# Patient Record
Sex: Female | Born: 1990 | Race: Black or African American | Hispanic: No | Marital: Single | Smoking: Former smoker
Health system: Southern US, Community
[De-identification: ages and names within clinical notes are randomized; demographics above are authoritative.]

## PROBLEM LIST (undated history)

## (undated) ENCOUNTER — Inpatient Hospital Stay (HOSPITAL_COMMUNITY): Payer: Self-pay

## (undated) DIAGNOSIS — A749 Chlamydial infection, unspecified: Secondary | ICD-10-CM

## (undated) DIAGNOSIS — R002 Palpitations: Secondary | ICD-10-CM

## (undated) DIAGNOSIS — R55 Syncope and collapse: Secondary | ICD-10-CM

## (undated) DIAGNOSIS — O139 Gestational [pregnancy-induced] hypertension without significant proteinuria, unspecified trimester: Secondary | ICD-10-CM

## (undated) DIAGNOSIS — R42 Dizziness and giddiness: Secondary | ICD-10-CM

## (undated) DIAGNOSIS — N83209 Unspecified ovarian cyst, unspecified side: Secondary | ICD-10-CM

## (undated) HISTORY — PX: NO PAST SURGERIES: SHX2092

## (undated) HISTORY — DX: Palpitations: R00.2

## (undated) HISTORY — DX: Syncope and collapse: R55

## (undated) HISTORY — DX: Dizziness and giddiness: R42

---

## 2016-01-16 ENCOUNTER — Encounter (HOSPITAL_COMMUNITY): Payer: Self-pay | Admitting: Emergency Medicine

## 2016-01-16 ENCOUNTER — Emergency Department (HOSPITAL_COMMUNITY)
Admission: EM | Admit: 2016-01-16 | Discharge: 2016-01-16 | Disposition: A | Discharge status: Home / Self Care | Payer: Medicaid Other | Attending: Emergency Medicine | Admitting: Emergency Medicine

## 2016-01-16 DIAGNOSIS — B9689 Other specified bacterial agents as the cause of diseases classified elsewhere: Secondary | ICD-10-CM

## 2016-01-16 DIAGNOSIS — F17210 Nicotine dependence, cigarettes, uncomplicated: Secondary | ICD-10-CM | POA: Diagnosis not present

## 2016-01-16 DIAGNOSIS — N760 Acute vaginitis: Secondary | ICD-10-CM | POA: Diagnosis not present

## 2016-01-16 DIAGNOSIS — Z3202 Encounter for pregnancy test, result negative: Secondary | ICD-10-CM | POA: Insufficient documentation

## 2016-01-16 DIAGNOSIS — R1030 Lower abdominal pain, unspecified: Secondary | ICD-10-CM | POA: Diagnosis present

## 2016-01-16 LAB — WET PREP, GENITAL
SPERM: NONE SEEN
TRICH WET PREP: NONE SEEN
YEAST WET PREP: NONE SEEN

## 2016-01-16 LAB — URINALYSIS, ROUTINE W REFLEX MICROSCOPIC
Bilirubin Urine: NEGATIVE
Glucose, UA: NEGATIVE mg/dL
Hgb urine dipstick: NEGATIVE
KETONES UR: NEGATIVE mg/dL
LEUKOCYTES UA: NEGATIVE
NITRITE: NEGATIVE
PH: 6 (ref 5.0–8.0)
Protein, ur: NEGATIVE mg/dL
SPECIFIC GRAVITY, URINE: 1.026 (ref 1.005–1.030)

## 2016-01-16 LAB — POC URINE PREG, ED: PREG TEST UR: NEGATIVE

## 2016-01-16 MED ORDER — LIDOCAINE HCL (PF) 1 % IJ SOLN
INTRAMUSCULAR | Status: AC
  Administered 2016-01-16: 5 mL
  Filled 2016-01-16: qty 5

## 2016-01-16 MED ORDER — KETOROLAC TROMETHAMINE 30 MG/ML IJ SOLN
30.0000 mg | Freq: Once | INTRAMUSCULAR | Status: AC
  Administered 2016-01-16: 30 mg via INTRAMUSCULAR
  Filled 2016-01-16: qty 1

## 2016-01-16 MED ORDER — METRONIDAZOLE 500 MG PO TABS: 500.0000 mg | ORAL_TABLET | Freq: Two times a day (BID) | ORAL | Status: DC

## 2016-01-16 MED ORDER — CEFTRIAXONE SODIUM 250 MG IJ SOLR
250.0000 mg | Freq: Once | INTRAMUSCULAR | Status: AC
  Administered 2016-01-16: 250 mg via INTRAMUSCULAR
  Filled 2016-01-16: qty 250

## 2016-01-16 MED ORDER — AZITHROMYCIN 250 MG PO TABS
1000.0000 mg | ORAL_TABLET | Freq: Every day | ORAL | Status: DC
  Administered 2016-01-16: 1000 mg via ORAL
  Filled 2016-01-16: qty 4

## 2016-01-16 NOTE — ED Notes (Signed)
Pt to ER via [facility] with complaint of lower abdominal pain that began 2 days ago. Pt reports white, thick discharge x1 week with foul odor. Pt is a/o x4. Reports ovarian cyst hx and that this feels different.

## 2016-01-16 NOTE — ED Provider Notes (Signed)
CSN: [id number]     Arrival date & time 01/16/16  1620 History   First MD Initiated Contact with Patient 01/16/16 1633     Chief Complaint  Patient presents with  . Abdominal Pain   (Consider location/radiation/quality/duration/timing/severity/associated sxs/prior Treatment) HPI 25 y.o. female with a hx of Ovarian Cysts, presents to the Emergency Department today complaining via EMS of lower abdominal pain x 2 days. States that the pain is 9/10 and feels like a sharp/cramping pain on both sides of abdomen. Has tried OTC naproxen with minimal relief. Notes white vaginal discharge with odor. No dysuria. No fevers. No CP/SOB. No headaches. No N/V/D. No other symptoms noted. Pt states that she is sexually active with one partner.   LMP- 12-11-15   History reviewed. No pertinent past medical history. History reviewed. No pertinent past surgical history. History reviewed. No pertinent family history. Social History  Substance Use Topics  . Smoking status: Current Every Day Smoker -- 0.25 packs/day    Types: Cigarettes  . Smokeless tobacco: None  . Alcohol Use: Yes     Comment: occassionally    OB History    No data available     Review of Systems ROS reviewed and all are negative for acute change except as noted in the HPI.  Allergies  Review of patient's allergies indicates not on file.  Home Medications   Prior to Admission medications   Not on File   BP 123/78 mmHg  Pulse 96  Resp 16  SpO2 98%  LMP 12/11/2015 (Approximate)   Physical Exam  Constitutional: She is oriented to person, place, and time. She appears well-developed and well-nourished.  HENT:  Head: Normocephalic and atraumatic.  Eyes: EOM are normal. Pupils are equal, round, and reactive to light.  Neck: Normal range of motion. Neck supple.  Cardiovascular: Normal rate, regular rhythm and normal heart sounds.   No murmur heard. Pulmonary/Chest: Effort normal and breath sounds normal. No respiratory distress.  She has no wheezes. She has no rales. She exhibits no tenderness.  Abdominal: Soft. Normal appearance and bowel sounds are normal. There is tenderness in the right lower quadrant, suprapubic area and left lower quadrant. There is no rigidity, no rebound, no guarding, no CVA tenderness, no tenderness at McBurney's point and negative Murphy's sign.  Musculoskeletal: Normal range of motion.  Neurological: She is alert and oriented to person, place, and time.  Skin: Skin is warm and dry.  Psychiatric: She has a normal mood and affect. Her behavior is normal. Thought content normal.  Nursing note and vitals reviewed.  Exam performed by Eston Estersyler M Sharnetta Gielow,  exam chaperoned Date: 01/16/2016 Pelvic exam: normal external genitalia without evidence of trauma. VULVA: normal appearing vulva with no masses, tenderness or lesion. VAGINA: normal appearing vagina with normal color and discharge, no lesions. CERVIX: normal appearing cervix without lesions, cervical motion tenderness absent, cervical os closed with out purulent discharge; vaginal discharge - white, copious and thick, Wet prep and DNA probe for chlamydia and GC obtained.   ADNEXA: normal adnexa in size, nontender and no masses UTERUS: uterus is normal size, shape, consistency and nontender.    ED Course  Procedures (including critical care time) Labs Review Labs Reviewed  WET PREP, GENITAL - Abnormal; Notable for the following:    Clue Cells Wet Prep HPF POC FEW (*)    WBC, Wet Prep HPF POC FEW (*)    All other components within normal limits  URINALYSIS, ROUTINE W REFLEX MICROSCOPIC (NOT AT [facility])  POC URINE PREG, ED  GC/CHLAMYDIA PROBE AMP ([facility]) NOT AT [facility]   Imaging Review No results found. I have personally reviewed and evaluated these images and lab results as part of my medical decision-making.   EKG Interpretation None      MDM  I have reviewed and evaluated the relevant laboratory values.I have reviewed the relevant  previous healthcare records.I obtained HPI from historian. Patient discussed with supervising physician  ED Course:  Assessment: Pt is a 24yF with hx ovarian cysts who presents with lower abdominal pain x 2 days. On exam, pt in NAD. Nontoxic/nonseptic appearing. VSS. Afebrile. Lungs CTA. Heart RRR. Abdomen TTP RLQ/LLQ/Suprapubic. Pelvic Exam showed copious white vaginal discharge. No CMT. No adnexal. Labs show few clue cells and few white cells. UA unremarkable. Urine preg negative. Given analgesia in ED. Given Rocephin/Azithro in ED. Plan is to DC home with Flagyl. Counseled on possible STD based on exam with results pending. At time of discharge, Patient is in no acute distress. Vital Signs are stable. Patient is able to ambulate. Patient able to tolerate PO.    Disposition/Plan:  DC Home Additional Verbal discharge instructions given and discussed with patient.  Pt Instructed to f/u with PCP in the next week for evaluation and treatment of symptoms. Return precautions given Pt acknowledges and agrees with plan  Supervising Physician Leta Baptist, MD   Final diagnoses:  Bacterial vaginosis       Audry Pili, PA-C 01/16/16 1758  Leta Baptist, MD 01/17/16 [phone]

## 2016-01-16 NOTE — Discharge Instructions (Signed)
Please read and follow all provided instructions.  Your diagnoses today include:  1. Bacterial vaginosis    Tests performed today include:  Test for gonorrhea and chlamydia. You will be notified by telephone if you have a positive result.  Vital signs. See below for your results today.   Medications:  You were treated for chlamydia (1 gram azithromycin pills) and gonorrhea (250mg  rocephin shot). Also receiving   Home care instructions:  Read educational materials contained in this packet and follow any instructions provided.   You should tell your partners about your infection and avoid having sex for one week to allow time for the medicine to work.  Follow-up instructions: You should follow-up with the [facility] to be tested for HIV, syphilis, and hepatitis -- all of which can be transmitted by sexual contact. We do not routinely screen for these in the Emergency Department.  STD Testing:  [facility] [place] [place] [facility]  [address], [place], phone [phone] or [phone]    Monday - Friday, call for an appointment  [facility] [place] [place] [facility]  [address], [place], phone [phone] or [phone]   Monday - Friday, call for an appointment  Return instructions:   Please return to the Emergency Department if you experience worsening symptoms.   Please return if you have any other emergent concerns.  Additional Information:  Your vital signs today were: BP 123/78 mmHg   Pulse 96   Resp 16   SpO2 98%   LMP 12/11/2015 (Approximate) If your blood pressure (BP) was elevated above 135/85 this visit, please have this repeated by your doctor within one month. --------------

## 2016-01-17 LAB — GC/CHLAMYDIA PROBE AMP (~~LOC~~) NOT AT ARMC
Chlamydia: NEGATIVE
Neisseria Gonorrhea: NEGATIVE

## 2016-08-18 ENCOUNTER — Inpatient Hospital Stay (HOSPITAL_COMMUNITY): Payer: Medicaid Other

## 2016-08-18 ENCOUNTER — Inpatient Hospital Stay (HOSPITAL_COMMUNITY)
Admission: AD | Admit: 2016-08-18 | Discharge: 2016-08-18 | Disposition: A | Discharge status: Home / Self Care | Payer: Medicaid Other | Source: Ambulatory Visit | Attending: Obstetrics and Gynecology | Admitting: Obstetrics and Gynecology

## 2016-08-18 ENCOUNTER — Encounter (HOSPITAL_COMMUNITY): Payer: Self-pay | Admitting: *Deleted

## 2016-08-18 DIAGNOSIS — F17210 Nicotine dependence, cigarettes, uncomplicated: Secondary | ICD-10-CM | POA: Diagnosis not present

## 2016-08-18 DIAGNOSIS — R109 Unspecified abdominal pain: Secondary | ICD-10-CM

## 2016-08-18 DIAGNOSIS — O26891 Other specified pregnancy related conditions, first trimester: Secondary | ICD-10-CM | POA: Diagnosis not present

## 2016-08-18 DIAGNOSIS — R1032 Left lower quadrant pain: Secondary | ICD-10-CM | POA: Diagnosis present

## 2016-08-18 DIAGNOSIS — O209 Hemorrhage in early pregnancy, unspecified: Secondary | ICD-10-CM

## 2016-08-18 DIAGNOSIS — O99330 Smoking (tobacco) complicating pregnancy, unspecified trimester: Secondary | ICD-10-CM | POA: Diagnosis not present

## 2016-08-18 HISTORY — DX: Chlamydial infection, unspecified: A74.9

## 2016-08-18 HISTORY — DX: Gestational (pregnancy-induced) hypertension without significant proteinuria, unspecified trimester: O13.9

## 2016-08-18 HISTORY — DX: Unspecified ovarian cyst, unspecified side: N83.209

## 2016-08-18 LAB — URINALYSIS, ROUTINE W REFLEX MICROSCOPIC
Bilirubin Urine: NEGATIVE
GLUCOSE, UA: NEGATIVE mg/dL
Ketones, ur: 15 mg/dL — AB
LEUKOCYTES UA: NEGATIVE
Nitrite: NEGATIVE
PH: 6 (ref 5.0–8.0)
PROTEIN: NEGATIVE mg/dL
Specific Gravity, Urine: >1.03 — ABNORMAL HIGH (ref 1.005–1.030)

## 2016-08-18 LAB — CBC
HCT: 36.1 % (ref 36.0–46.0)
Hemoglobin: 11.9 g/dL — ABNORMAL LOW (ref 12.0–15.0)
MCH: 24.5 pg — AB (ref 26.0–34.0)
MCHC: 33 g/dL (ref 30.0–36.0)
MCV: 74.3 fL — ABNORMAL LOW (ref 78.0–100.0)
Platelets: 281 10*3/uL (ref 150–400)
RBC: 4.86 MIL/uL (ref 3.87–5.11)
RDW: 15.2 % (ref 11.5–15.5)
WBC: 6.8 10*3/uL (ref 4.0–10.5)

## 2016-08-18 LAB — WET PREP, GENITAL
CLUE CELLS WET PREP: NONE SEEN
Sperm: NONE SEEN
TRICH WET PREP: NONE SEEN
YEAST WET PREP: NONE SEEN

## 2016-08-18 LAB — URINE MICROSCOPIC-ADD ON
Bacteria, UA: NONE SEEN
RBC / HPF: NONE SEEN RBC/hpf (ref 0–5)

## 2016-08-18 LAB — POCT PREGNANCY, URINE: Preg Test, Ur: POSITIVE — AB

## 2016-08-18 LAB — HCG, QUANTITATIVE, PREGNANCY: HCG, BETA CHAIN, QUANT, S: 518 m[IU]/mL — AB (ref <5)

## 2016-08-18 LAB — ABO/RH: ABO/RH(D): O POS

## 2016-08-18 MED ORDER — PROMETHAZINE HCL 25 MG PO TABS: 25.0000 mg | ORAL_TABLET | Freq: Four times a day (QID) | ORAL | 0 refills | Status: DC | PRN

## 2016-08-18 NOTE — Discharge Instructions (Signed)

## 2016-08-18 NOTE — MAU Note (Signed)
Just found out she was preg.  On the 11th, started bleeding out of nowhere, is spotting now..  On the ? 8th- had bad cramping  In LLQ. No longer having the pain.  Went to health dept today, found out she was preg.  Period at the end of Oct, came early

## 2016-08-18 NOTE — MAU Provider Note (Signed)
History     CSN: [id number]  Arrival date and time: 08/18/16 1255   None     Chief Complaint  Patient presents with  . Abdominal Pain  . Vaginal Bleeding  . Possible Pregnancy   HPI  Pt is 25 y.o. G3P1011 Unknownwho was sent from the Health Dept with + pregnancy test when she went for appointment for contraception.  Pt had bleeding on 11/11 but spotting last 3 days.  Pt has also had LLQ cramping- pain not noticeable now.   Pt has been nauseated but not vomiting. Pt denies constipation, diarrhea or UTI sx. Pt has hx of SAB at about [redacted] weeks [place] about [place] ago [place].  RN note; Wendy FoundsJolynn K Spurlock-Frizzell, RN    [] Hide copied text [] Hover for attribution information Just found out she was preg.  On the 11th, started bleeding out of nowhere, is spotting now..  On the ? 8th- had bad cramping  In LLQ. No longer having the pain.  Went to health dept today, found out she was preg.  Period at the end of Oct, came early       No past medical history on file.  No past surgical history on file.  No family history on file.  Social History  Substance Use Topics  . Smoking status: Current Every Day Smoker    Packs/day: 0.25    Types: Cigarettes  . Smokeless tobacco: Not on file  . Alcohol use Yes     Comment: occassionally     Allergies: No Known Allergies  Prescriptions Prior to Admission  Medication Sig Dispense Refill Last Dose  . metroNIDAZOLE (FLAGYL) 500 MG tablet Take 1 tablet (500 mg total) by mouth 2 (two) times daily. 14 tablet 0     Review of Systems  Constitutional: Negative for chills and fever.  Gastrointestinal: Positive for abdominal pain and nausea. Negative for constipation, diarrhea and vomiting.  Genitourinary: Negative for dysuria.   Physical Exam   Blood pressure 113/62, pulse 78, temperature 98.1 F (36.7 C), temperature source Oral, resp. rate 16, height 5\' 2"  (1.575 m), weight 125 lb 12.8 oz (57.1 kg), last menstrual period  07/31/2016.  Physical Exam  Nursing note and vitals reviewed. Constitutional: She is oriented to person, place, and time. She appears well-developed and well-nourished. No distress.  HENT:  Head: Normocephalic.  Eyes: Pupils are equal, round, and reactive to light.  Neck: Normal range of motion. Neck supple.  Cardiovascular: Normal rate.   Respiratory: Effort normal.  GI: Soft.  Genitourinary:  Genitourinary Comments: Small amount of dark brown blood in vault; cervix closed, NT; uterus NSSC NT  Musculoskeletal: Normal range of motion.  Neurological: She is alert and oriented to person, place, and time.  Skin: Skin is warm and dry.  Psychiatric: She has a normal mood and affect.    MAU Course  Procedures Results for orders placed or performed during the hospital encounter of 08/18/16 (from the past 24 hour(s))  Urinalysis, Routine w reflex microscopic (not at [facility])     Status: Abnormal   Collection Time: 08/18/16  1:15 PM  Result Value Ref Range   Color, Urine YELLOW YELLOW   APPearance CLEAR CLEAR   Specific Gravity, Urine >1.030 (H) 1.005 - 1.030   pH 6.0 5.0 - 8.0   Glucose, UA NEGATIVE NEGATIVE mg/dL   Hgb urine dipstick SMALL (A) NEGATIVE   Bilirubin Urine NEGATIVE NEGATIVE   Ketones, ur 15 (A) NEGATIVE mg/dL   Protein, ur NEGATIVE  NEGATIVE mg/dL   Nitrite NEGATIVE NEGATIVE   Leukocytes, UA NEGATIVE NEGATIVE  Urine microscopic-add on     Status: Abnormal   Collection Time: 08/18/16  1:15 PM  Result Value Ref Range   Squamous Epithelial / LPF 0-5 (A) NONE SEEN   WBC, UA 0-5 0 - 5 WBC/hpf   RBC / HPF NONE SEEN 0 - 5 RBC/hpf   Bacteria, UA NONE SEEN NONE SEEN   Urine-Other MUCOUS PRESENT   Pregnancy, urine POC     Status: Abnormal   Collection Time: 08/18/16  1:21 PM  Result Value Ref Range   Preg Test, Ur POSITIVE (A) NEGATIVE  CBC     Status: Abnormal   Collection Time: 08/18/16  1:27 PM  Result Value Ref Range   WBC 6.8 4.0 - 10.5 K/uL   RBC 4.86 3.87 - 5.11  MIL/uL   Hemoglobin 11.9 (L) 12.0 - 15.0 g/dL   HCT 13.036.1 86.536.0 - 78.446.0 %   MCV 74.3 (L) 78.0 - 100.0 fL   MCH 24.5 (L) 26.0 - 34.0 pg   MCHC 33.0 30.0 - 36.0 g/dL   RDW 69.615.2 29.511.5 - 28.415.5 %   Platelets 281 150 - 400 K/uL  hCG, quantitative, pregnancy     Status: Abnormal   Collection Time: 08/18/16  1:27 PM  Result Value Ref Range   hCG, Beta Chain, Quant, S 518 (H) <5 mIU/mL  ABO/Rh     Status: None   Collection Time: 08/18/16  1:27 PM  Result Value Ref Range   ABO/RH(D) O POS   Wet prep, genital     Status: Abnormal   Collection Time: 08/18/16  1:45 PM  Result Value Ref Range   Yeast Wet Prep HPF POC NONE SEEN NONE SEEN   Trich, Wet Prep NONE SEEN NONE SEEN   Clue Cells Wet Prep HPF POC NONE SEEN NONE SEEN   WBC, Wet Prep HPF POC FEW (A) NONE SEEN   Sperm NONE SEEN   GC/chlamydia pending Cannot rule out ectopic Assessment and Plan  Abdominal pain in pregnancy Pregnancy of unknown anatomical location Return Thursday afternoon b/t 1-3pm for repeat HCG- in clinic Return sooner for increase in pain or bleeding  LINEBERRY,Wendy Maddox 08/18/2016, 1:19 PM

## 2016-08-19 LAB — HIV ANTIBODY (ROUTINE TESTING W REFLEX): HIV Screen 4th Generation wRfx: NONREACTIVE

## 2016-08-19 LAB — GC/CHLAMYDIA PROBE AMP (~~LOC~~) NOT AT ARMC
CHLAMYDIA, DNA PROBE: NEGATIVE
NEISSERIA GONORRHEA: NEGATIVE

## 2016-08-20 ENCOUNTER — Encounter (HOSPITAL_COMMUNITY): Payer: Self-pay | Admitting: Student

## 2016-08-20 ENCOUNTER — Ambulatory Visit: Payer: Medicaid Other

## 2016-08-20 ENCOUNTER — Inpatient Hospital Stay (HOSPITAL_COMMUNITY)
Admission: AD | Admit: 2016-08-20 | Discharge: 2016-08-20 | Disposition: A | Discharge status: Home / Self Care | Payer: Medicaid Other | Source: Ambulatory Visit | Attending: Obstetrics & Gynecology | Admitting: Obstetrics & Gynecology

## 2016-08-20 DIAGNOSIS — Z87891 Personal history of nicotine dependence: Secondary | ICD-10-CM | POA: Insufficient documentation

## 2016-08-20 DIAGNOSIS — O0281 Inappropriate change in quantitative human chorionic gonadotropin (hCG) in early pregnancy: Secondary | ICD-10-CM | POA: Insufficient documentation

## 2016-08-20 LAB — HCG, QUANTITATIVE, PREGNANCY: HCG, BETA CHAIN, QUANT, S: 617 m[IU]/mL — AB (ref <5)

## 2016-08-20 NOTE — Progress Notes (Signed)
Pt here today for STAT beta lab which was mistakenly scheduled for 1530.  Per Dr. Adrian BlackwaterStinson, pt can go to MAU for beta lab.  Notified Morrie Sheldonshley, MAU charge, that pt will be coming up for just a STAT beta lab due to the pt not being able to stay at the office due to office closing at 1700.  Pt agreed and had no further questions.

## 2016-08-20 NOTE — MAU Note (Signed)
Pt here for f/u lab work. Reports she is stil lhaving vaginal bleeding a little heavier than it was the other day. Denies any pain or cramping.

## 2016-08-20 NOTE — MAU Provider Note (Signed)
History   [id number]   Chief Complaint  Patient presents with  . Follow-up    HPI Wendy Maddox is a 25 y.o. female [phone] here for follow-up BHCG.  Upon review of the records patient was first seen on 11/14 for abdominal pain & vaginal bleeding. BHCG on that day was 518. Ultrasound showed no IUP, small echogenic area adjacent to left ovary, can't r/o ectopic. GC/CT and wet prep were collected.  Results were negative. Pt discharged home to return for BHCG. Pt here today with no report of abdominal pain. Some vaginal bleeding since last night. All other systems negative.   Patient's last menstrual period was 07/31/2016.  OB History  Gravida Para Term Preterm AB Living  4 1 1   1 1   SAB TAB Ectopic Multiple Live Births  1       1    # Outcome Date GA Lbr Len/2nd Weight Sex Delivery Anes PTL Lv  4 Current           3 Gravida           2 SAB           1 Term               Past Medical History:  Diagnosis Date  . Chlamydia   . Ovarian cyst   . Pregnancy induced hypertension     Family History  Problem Relation Age of Onset  . Diabetes Mother   . Asthma Father   . Asthma Daughter   . Diabetes Maternal Grandmother   . Diabetes Paternal Grandmother     Social History   Social History  . Marital status: Single    Spouse name: N/A  . Number of children: N/A  . Years of education: N/A   Social History Main Topics  . Smoking status: Former Smoker    Packs/day: 0.25    Types: Cigarettes  . Smokeless tobacco: Never Used     Comment: Oct 2017  . Alcohol use Yes     Comment: every other week  . Drug use: No  . Sexual activity: Yes   Other Topics Concern  . None   Social History Narrative  . None    No Known Allergies  No current facility-administered medications on file prior to encounter.    Current Outpatient Prescriptions on File Prior to Encounter  Medication Sig Dispense Refill  . acetaminophen (TYLENOL) 500 MG tablet Take 1,000 mg by mouth every 6  (six) hours as needed for mild pain, moderate pain or headache.    . promethazine (PHENERGAN) 25 MG tablet Take 1 tablet (25 mg total) by mouth every 6 (six) hours as needed for nausea or vomiting. 30 tablet 0     Physical Exam   Vitals:   08/20/16 1713  BP: 139/94  Pulse: 88  Resp: 18  Temp: 98.4 F (36.9 C)  TempSrc: Oral    Physical Exam  Nursing note and vitals reviewed. Constitutional: She is oriented to person, place, and time. She appears well-developed and well-nourished. No distress.  HENT:  Head: Normocephalic and atraumatic.  Eyes: Conjunctivae are normal. Right eye exhibits no discharge. Left eye exhibits no discharge. No scleral icterus.  Neck: Normal range of motion.  Cardiovascular: Normal rate.   Respiratory: Effort normal. No respiratory distress.  GI: Soft. There is no tenderness.  Neurological: She is alert and oriented to person, place, and time.  Skin: Skin is warm and dry. She is not diaphoretic.  Psychiatric: She has a normal mood and affect. Her behavior is normal. Judgment and thought content normal.    MAU Course  Procedures Component     Latest Ref Rng & Units 08/18/2016 08/20/2016  HCG, Beta Chain, Quant, S     <5 mIU/mL 518 (H) 617 (H)   MDM Discussed results with Dr. Jolayne Pantheronstant. Will offer MTX as this is not a normal pregnancy.  Discussed results & recommendation with pt. Pt appropriately upset regarding failed pregnancy but declines MTX at this time. Pt agreeable to returning for repeat BHCG in 2 days. Discussed risks associated with untreated ectopic pregnancy including emergent surgery, loss of fertility, and death.   Assessment and Plan  25 y.o. G4P1011 at [id number] wks Pregnancy Follow-up BHCG 1. Inappropriate change in quantitative hCG in early pregnancy    P: Discharge home Strict return precautions discussed Return Saturday evening to MAU for BHCG   Judeth HornErin Mathews Stuhr, NP 08/20/2016 5:41 PM

## 2016-08-20 NOTE — Discharge Instructions (Signed)
Ectopic Pregnancy An ectopic pregnancy happens when a fertilized egg grows outside the uterus. A pregnancy cannot live outside of the uterus. This problem often happens in the fallopian tube. It is often caused by damage to the fallopian tube. If this problem is found early, you may be treated with medicine. If your tube tears or bursts open (ruptures), you will bleed inside. This is an emergency. You will need surgery. Get help right away. What are the signs or symptoms? You may have normal pregnancy symptoms at first. These include:  Missing your period.  Feeling sick to your stomach (nauseous).  Being tired.  Having tender breasts. Then, you may start to have symptoms that are not normal. These include:  Pain with sex (intercourse).  Bleeding from the vagina. This includes light bleeding (spotting).  Belly (abdomen) or lower belly cramping or pain. This may be felt on one side.  A fast heartbeat (pulse).  Passing out (fainting) after going poop (bowel movement). If your tube tears, you may have symptoms such as:  Really bad pain in the belly or lower belly. This happens suddenly.  Dizziness.  Passing out.  Shoulder pain. Get help right away if: You have any of these symptoms. This is an emergency. This information is not intended to replace advice given to you by your health care provider. Make sure you discuss any questions you have with your health care provider. Document Released: 12/18/2008 Document Revised: 02/27/2016 Document Reviewed: 05/03/2013 Elsevier Interactive Patient Education  2017 [employer] Abdominal Pain During Pregnancy Abdominal pain is common in pregnancy. Most of the time, it does not cause harm. There are many causes of abdominal pain. Some causes are more serious than others and sometimes the cause is not known. Abdominal pain can be a sign that something is very wrong with the pregnancy or the pain may have nothing to do with the pregnancy.  Always tell your health care provider if you have any abdominal pain. Follow these instructions at home:  Do not have sex or put anything in your vagina until your symptoms go away completely.  Watch your abdominal pain for any changes.  Get plenty of rest until your pain improves.  Drink enough fluid to keep your urine clear or pale yellow.  Take over-the-counter or prescription medicines only as told by your health care provider.  Keep all follow-up visits as told by your health care provider. This is important. Contact a health care provider if:  You have a fever.  Your pain gets worse or you have cramping.  Your pain continues after resting. Get help right away if:  You are bleeding, leaking fluid, or passing tissue from the vagina.  You have vomiting or diarrhea that does not go away.  You have painful or bloody urination.  You notice a decrease in your baby's movements.  You feel very weak or faint.  You have shortness of breath.  You develop a severe headache with abdominal pain.  You have abnormal vaginal discharge with abdominal pain. This information is not intended to replace advice given to you by your health care provider. Make sure you discuss any questions you have with your health care provider. Document Released: 09/21/2005 Document Revised: 07/02/2016 Document Reviewed: 04/20/2013 Elsevier Interactive Patient Education  2017 [employer]

## 2016-08-22 ENCOUNTER — Inpatient Hospital Stay (HOSPITAL_COMMUNITY)
Admission: AD | Admit: 2016-08-22 | Discharge: 2016-08-22 | Disposition: A | Discharge status: Home / Self Care | Payer: Medicaid Other | Source: Ambulatory Visit | Attending: Obstetrics & Gynecology | Admitting: Obstetrics & Gynecology

## 2016-08-22 DIAGNOSIS — Z3490 Encounter for supervision of normal pregnancy, unspecified, unspecified trimester: Secondary | ICD-10-CM | POA: Diagnosis not present

## 2016-08-22 DIAGNOSIS — Z3A01 Less than 8 weeks gestation of pregnancy: Secondary | ICD-10-CM | POA: Insufficient documentation

## 2016-08-22 DIAGNOSIS — O26891 Other specified pregnancy related conditions, first trimester: Secondary | ICD-10-CM | POA: Diagnosis present

## 2016-08-22 DIAGNOSIS — O3680X0 Pregnancy with inconclusive fetal viability, not applicable or unspecified: Secondary | ICD-10-CM

## 2016-08-22 LAB — CBC WITH DIFFERENTIAL/PLATELET
BASOS ABS: 0 10*3/uL (ref 0.0–0.1)
Basophils Relative: 0 %
EOS ABS: 0.1 10*3/uL (ref 0.0–0.7)
EOS PCT: 1 %
HCT: 38.1 % (ref 36.0–46.0)
Hemoglobin: 12.5 g/dL (ref 12.0–15.0)
LYMPHS PCT: 30 %
Lymphs Abs: 2.8 10*3/uL (ref 0.7–4.0)
MCH: 24.3 pg — ABNORMAL LOW (ref 26.0–34.0)
MCHC: 32.8 g/dL (ref 30.0–36.0)
MCV: 74 fL — AB (ref 78.0–100.0)
Monocytes Absolute: 0.4 10*3/uL (ref 0.1–1.0)
Monocytes Relative: 4 %
Neutro Abs: 6 10*3/uL (ref 1.7–7.7)
Neutrophils Relative %: 65 %
PLATELETS: 313 10*3/uL (ref 150–400)
RBC: 5.15 MIL/uL — AB (ref 3.87–5.11)
RDW: 14.9 % (ref 11.5–15.5)
WBC: 9.3 10*3/uL (ref 4.0–10.5)

## 2016-08-22 LAB — CREATININE, SERUM
CREATININE: 0.65 mg/dL (ref 0.44–1.00)
GFR calc Af Amer: >60 mL/min (ref >60)

## 2016-08-22 LAB — BUN: BUN: 8 mg/dL (ref 6–20)

## 2016-08-22 LAB — HCG, QUANTITATIVE, PREGNANCY: HCG, BETA CHAIN, QUANT, S: 643 m[IU]/mL — AB (ref <5)

## 2016-08-22 LAB — AST: AST: 19 U/L (ref 15–41)

## 2016-08-22 MED ORDER — METHOTREXATE INJECTION FOR WOMEN'S HOSPITAL
50.0000 mg/m2 | Freq: Once | INTRAMUSCULAR | Status: AC
  Administered 2016-08-22: 80 mg via INTRAMUSCULAR
  Filled 2016-08-22: qty 1.6

## 2016-08-22 NOTE — MAU Provider Note (Signed)
History   Chief Complaint:  Follow-up   Wendy SchaumannMikalah Maddox is  25 y.o. G4P1011 Patient's last menstrual period was 07/31/2016.Marland Kitchen. Patient is here for follow up of quantitative HCG and ongoing surveillance of pregnancy status.   She is 3643w1d weeks gestation  by LMP. Since her last visit, the patient is without new complaint.   The patient reports bleeding as lighter than at her previous visit. General ROS: no abdominal pain, very light vaginal bleeding, no fever, no nausea or vomiting  Her previous Quantitative HCG values are: 08-18-16 = 518 08-20-16 = 617  Client declined methotrexate at her last visit, so follow up plan was to return in 48 hours which is today.    Physical Exam   Blood pressure 135/80, pulse 90, temperature 98.4 F (36.9 C), resp. rate 18, last menstrual period 07/31/2016.  GENERAL: Well-developed, well-nourished female in no acute distress.  HEENT: Normocephalic, atraumatic.  LUNGS: Effort normal  HEART: Regular rate  SKIN: Warm, dry and without erythema  PSYCH: Normal mood and affect   Labs:  hCG, Beta Chain, Quant, S <5 mIU/mL 643    617CM    518CM     Results for orders placed or performed during the hospital encounter of 08/22/16 (from the past 24 hour(s))  CBC WITH DIFFERENTIAL     Status: Abnormal   Collection Time: 08/22/16  4:34 PM  Result Value Ref Range   WBC 9.3 4.0 - 10.5 K/uL   RBC 5.15 (H) 3.87 - 5.11 MIL/uL   Hemoglobin 12.5 12.0 - 15.0 g/dL   HCT 16.138.1 09.636.0 - 04.546.0 %   MCV 74.0 (Wendy) 78.0 - 100.0 fL   MCH 24.3 (Wendy) 26.0 - 34.0 pg   MCHC 32.8 30.0 - 36.0 g/dL   RDW 40.914.9 81.111.5 - 91.415.5 %   Platelets 313 150 - 400 K/uL   Neutrophils Relative % 65 %   Neutro Abs 6.0 1.7 - 7.7 K/uL   Lymphocytes Relative 30 %   Lymphs Abs 2.8 0.7 - 4.0 K/uL   Monocytes Relative 4 %   Monocytes Absolute 0.4 0.1 - 1.0 K/uL   Eosinophils Relative 1 %   Eosinophils Absolute 0.1 0.0 - 0.7 K/uL   Basophils Relative 0 %   Basophils Absolute 0.0 0.0 - 0.1 K/uL  AST      Status: None   Collection Time: 08/22/16  4:34 PM  Result Value Ref Range   AST 19 15 - 41 U/Wendy  BUN     Status: None   Collection Time: 08/22/16  4:34 PM  Result Value Ref Range   BUN 8 6 - 20 mg/dL  Creatinine, serum     Status: None   Collection Time: 08/22/16  4:34 PM  Result Value Ref Range   Creatinine, Ser 0.65 0.44 - 1.00 mg/dL   GFR calc non Af Amer >60 >60 mL/min   GFR calc Af Amer >60 >60 mL/min  hCG, quantitative, pregnancy     Status: Abnormal   Collection Time: 08/22/16  4:45 PM  Result Value Ref Range   hCG, Beta Chain, Quant, S 643 (H) <5 mIU/mL     Ultrasound Studies:   CLINICAL DATA:  Vaginal bleeding. Left lower quadrant pain. Quantitative beta HCG 518.  EXAM: OBSTETRIC <14 WK US AND TRANSVAGINAL OB US  TECHNIQUE: Both transabdominal and transvaginal ultrasound examinations were performed for complete evaluation of the gestation as well as the maternal uterus, adnexal regions, and pelvic cul-de-sac. Transvaginal technique was performed to assess early  pregnancy.  COMPARISON:  None.  FINDINGS: Intrauterine gestational sac: None visualized  Yolk sac:  Not visualized  Embryo:  Not visualize   Subchorionic hemorrhage:  None visualized.  Maternal uterus/adnexae: Within the left adnexa, there is a small echogenic area adjacent to the left ovary measuring up to 14 mm. This may reflect a loop of collapsed bowel, but recommend close follow-up.  IMPRESSION: No intrauterine pregnancy visualized. Differential considerations would include early intrauterine pregnancy too early to visualize, spontaneous abortion, or occult ectopic pregnancy. In addition, there is a small echogenic area within the left adnexa adjacent to the left ovary which is indeterminate. This may represent a collapsed loop of bowel. Recommend close clinical followup and serial quantitative beta HCGs and ultrasounds.  Consult with Dr. Penne LashLeggett who reviewed the BHCG levels  and the ultrasound.  Plan is to recommend methotrexate for abnormally rising quant levels.  Blood type O positive  Client and her mother here.  Disappointed that this is not a normal pregnancy, but client wants to proceed with methotrexate treatment which is recommended again today.  Reviewed possible risks of continuing an ectopic pregnancy; client consents to methotrexate and tells her mother she wants the medication.  Assessment: Abnormally rising quant Pregnancy of unknown anatomic location  Plan: Reviewed again that the pregnancy hormone level trend does not represent a normally developing pregnancy and is consistent with an ectopic pregnancy.  Client was given info to read at her last visit and wants to proceed with the methotrexate treatment. Additional labs ordered prior to methotrexate injection and these were normal. Methotrexate given in MAU  Return on Tuesday after work at 3 pm to MAU for Day 4 labs after methotrexate. ([facility] Return on Friday morning to the Maternity Admissions Unit for Day 7 labs. ([facility] clinic closed for Thanksgiving holiday) Return sooner if you have severe abdominal pain or severe vaginal bleeding. Continue pelvic rest - nothing in the vagina - no sex, no tampons, no douching.  Wendy Maddox Wendy Maddox 08/22/2016, 4:42 PM

## 2016-08-22 NOTE — Discharge Instructions (Signed)
Return on Tuesday at 11 am in the Clinic [facility] for Day 4 labs after methotrexate. Return on Friday morning to the Maternity Admissions Unit for Day 7 labs. Return sooner if you have severe abdominal pain or severe vaginal bleeding. Continue pelvic rest - nothing in the vagina - no sex, no tampons, no douching.

## 2016-08-22 NOTE — MAU Note (Signed)
Patient presents for f/u HCG, bleeding is lighter denies pain.

## 2016-08-25 ENCOUNTER — Encounter (HOSPITAL_COMMUNITY): Payer: Self-pay | Admitting: *Deleted

## 2016-08-25 ENCOUNTER — Inpatient Hospital Stay (HOSPITAL_COMMUNITY): Payer: Medicaid Other

## 2016-08-25 ENCOUNTER — Inpatient Hospital Stay (HOSPITAL_COMMUNITY)
Admission: AD | Admit: 2016-08-25 | Discharge: 2016-08-25 | Disposition: A | Discharge status: Home / Self Care | Payer: Medicaid Other | Source: Ambulatory Visit | Attending: Obstetrics & Gynecology | Admitting: Obstetrics & Gynecology

## 2016-08-25 DIAGNOSIS — O26891 Other specified pregnancy related conditions, first trimester: Secondary | ICD-10-CM | POA: Diagnosis not present

## 2016-08-25 DIAGNOSIS — Z87891 Personal history of nicotine dependence: Secondary | ICD-10-CM | POA: Insufficient documentation

## 2016-08-25 DIAGNOSIS — Z3A01 Less than 8 weeks gestation of pregnancy: Secondary | ICD-10-CM | POA: Diagnosis not present

## 2016-08-25 DIAGNOSIS — O009 Unspecified ectopic pregnancy without intrauterine pregnancy: Secondary | ICD-10-CM | POA: Diagnosis not present

## 2016-08-25 DIAGNOSIS — R109 Unspecified abdominal pain: Secondary | ICD-10-CM | POA: Diagnosis present

## 2016-08-25 DIAGNOSIS — Z09 Encounter for follow-up examination after completed treatment for conditions other than malignant neoplasm: Secondary | ICD-10-CM | POA: Insufficient documentation

## 2016-08-25 DIAGNOSIS — O26899 Other specified pregnancy related conditions, unspecified trimester: Secondary | ICD-10-CM

## 2016-08-25 LAB — HCG, QUANTITATIVE, PREGNANCY: hCG, Beta Chain, Quant, S: 641 m[IU]/mL — ABNORMAL HIGH (ref <5)

## 2016-08-25 MED ORDER — TRAMADOL HCL 50 MG PO TABS: 50.0000 mg | ORAL_TABLET | Freq: Four times a day (QID) | ORAL | 0 refills | Status: DC | PRN

## 2016-08-25 NOTE — MAU Provider Note (Signed)
History   [id number]   Chief Complaint  Patient presents with  . Follow-up    HPI Wendy Maddox is a 25 y.o. female G3P1011 here for follow-up BHCG. Pt received methotrexate for ectopic pregnancy on 11/18 & is here for day 4 labs. Patient reports worsening pain since the day after receiving the injection. Pain is focused in the LLQ and radiates down front of left thigh. Rates pain 9/10. Has been taking tylenol without relief. Reports some light pink spotting. Denies cough, shoulder pain, syncope, dizziness.   Patient's last menstrual period was 07/31/2016.  OB History  Gravida Para Term Preterm AB Living  3 1 1   1 1   SAB TAB Ectopic Multiple Live Births  1       1    # Outcome Date GA Lbr Len/2nd Weight Sex Delivery Anes PTL Lv  3 Current           2 SAB           1 Term     F Vag-Spont   LIV      Past Medical History:  Diagnosis Date  . Chlamydia   . Ovarian cyst   . Pregnancy induced hypertension     Family History  Problem Relation Age of Onset  . Diabetes Mother   . Asthma Father   . Asthma Daughter   . Diabetes Maternal Grandmother   . Diabetes Paternal Grandmother     Social History   Social History  . Marital status: Single    Spouse name: N/A  . Number of children: N/A  . Years of education: N/A   Social History Main Topics  . Smoking status: Former Smoker    Packs/day: 0.25    Types: Cigarettes  . Smokeless tobacco: Never Used     Comment: Oct 2017  . Alcohol use Yes     Comment: every other week  . Drug use: No  . Sexual activity: Yes   Other Topics Concern  . None   Social History Narrative  . None    No Known Allergies  No current facility-administered medications on file prior to encounter.    Current Outpatient Prescriptions on File Prior to Encounter  Medication Sig Dispense Refill  . acetaminophen (TYLENOL) 500 MG tablet Take 1,000 mg by mouth every 6 (six) hours as needed for mild pain, moderate pain or headache.        Physical Exam   Vitals:   08/25/16 1652  BP: 118/60  Pulse: 83  Resp: 16  Temp: 98.9 F (37.2 C)  TempSrc: Oral    Physical Exam  Nursing note and vitals reviewed. Constitutional: She is oriented to person, place, and time. She appears well-developed and well-nourished. No distress.  HENT:  Head: Normocephalic and atraumatic.  Eyes: Conjunctivae are normal. Right eye exhibits no discharge. Left eye exhibits no discharge. No scleral icterus.  Neck: Normal range of motion.  Cardiovascular: Normal rate, regular rhythm and normal heart sounds.   No murmur heard. Respiratory: Effort normal and breath sounds normal. No respiratory distress. She has no wheezes.  GI: Soft. Bowel sounds are normal. She exhibits no distension. There is tenderness in the left lower quadrant.  Neurological: She is alert and oriented to person, place, and time.  Skin: Skin is warm and dry. She is not diaphoretic.  Psychiatric: She has a normal mood and affect. Her behavior is normal. Judgment and thought content normal.    MAU Course  Procedures Component  Latest Ref Rng & Units 08/18/2016 08/20/2016 08/22/2016 08/25/2016  HCG, Beta Chain, Quant, S     <5 mIU/mL 518 (H) 617 (H) 643 (H) MTX given 641 (H)   [place] Ob Transvaginal  Result Date: 08/25/2016 CLINICAL DATA:  25 year old female with vaginal bleeding and left lower quadrant pain this month. No IUP on ultrasound 08/18/2016. Quantitative beta HCG peaked on 08/22/2016 (643) and is 641 today. Reportedly status post 1 dose of methotrexate for suspected ectopic pregnancy. Light bleeding today. Subsequent encounter. EXAM: TRANSVAGINAL OB ULTRASOUND TECHNIQUE: Transvaginal ultrasound was performed for complete evaluation of the gestation as well as the maternal uterus, adnexal regions, and pelvic cul-de-sac. COMPARISON:  08/18/2016 FINDINGS: Intrauterine gestational sac: None Maternal uterus/adnexae: The endometrium appears bland (image 3). Trace  simple appearing free fluid in the cul-de-sac (image 9). The right ovary appears stable measuring 1.9 x 2.3 by 3.3 cm and might contain a corpus luteum (image 25). The left ovary is unchanged in appearance from the prior study measuring 3.6 x 1.8 x 2.1 cm and contains a complex area measuring about 1.8 cm (image 59). There does not appear to be convincing internal vascularity on Doppler today (same image). IMPRESSION: 1. No evidence of ruptured ectopic pregnancy, with only trace simple appearing free fluid in the cul-de-sac. 2. Stable ultrasound appearance of the pelvis since 08/18/2016; no IUP detected and the suspicious 1.8 cm complex area in the left ovary appears unchanged. Electronically Signed   By: Odessa FlemingH  Hall M.D.   On: 08/25/2016 18:21     MDM BHCG ordered Will repeat u/s d/t pain BHCG stable; no rise Ultrasound similar to previous study; adnexal mass remains same size & trace free fluid Discussed results with Dr. Macon LargeAnyanwu. Will d/c home with tramadol & strict return precautions for worsening symptoms, otherwise return Friday for day 7 BHCG Assessment and Plan  25 y.o. G3P1011 at 4442w4d wks Pregnancy Follow-up BHCG 1. Ectopic pregnancy   2. Abdominal pain affecting pregnancy    P: Discharge home rx tramadol Alternate with tylenol prn pain Discussed reasons to return to MAU  Return Friday for day 7 BHCG   Judeth HornErin Liticia Gasior, NP 08/25/2016 5:21 PM

## 2016-08-25 NOTE — Discharge Instructions (Signed)
Ectopic Pregnancy °An ectopic pregnancy is when the fertilized egg attaches (implants) outside the uterus. Most ectopic pregnancies occur in one of the tubes where eggs travel from the ovary to the uterus (fallopian tubes), but the implanting can occur in other locations. In rare cases, ectopic pregnancies occur on the ovary, intestine, pelvis, abdomen, or cervix. In an ectopic pregnancy, the fertilized egg does not have the ability to develop into a normal, healthy baby. °A ruptured ectopic pregnancy is one in which tearing or bursting of a fallopian tube causes internal bleeding. Often, there is intense lower abdominal pain, and vaginal bleeding sometimes occurs. Having an ectopic pregnancy can be life-threatening. If this dangerous condition is not treated, it can lead to blood loss, shock, or even death. °What are the causes? °The most common cause of this condition is damage to one of the fallopian tubes. A fallopian tube may be narrowed or blocked, and that keeps the fertilized egg from reaching the uterus. °What increases the risk? °This condition is more likely to develop in women of childbearing age who have different levels of risk. The levels of risk can be divided into three categories. °High risk  °· You have gone through infertility treatment. °· You have had an ectopic pregnancy before. °· You have had surgery on the fallopian tubes, or another surgical procedure, such as an abortion. °· You have had surgery to have the fallopian tubes tied (tubal ligation). °· You have problems or diseases of the fallopian tubes. °· You have been exposed to diethylstilbestrol (DES). This medicine was used until 1971, and it had effects on babies whose mothers took the medicine. °· You become pregnant while using an IUD (intrauterine device) for birth control. °Moderate risk  °· You have a history of infertility. °· You have had an STI (sexually transmitted infection). °· You have a history of pelvic inflammatory  disease (PID). °· You have scarring from endometriosis. °· You have multiple sexual partners. °· You smoke. °Low risk  °· You have had pelvic surgery. °· You use vaginal douches. °· You became sexually active before age 18. °What are the signs or symptoms? °Common symptoms of this condition include normal pregnancy symptoms, such as missing a period, nausea, tiredness, abdominal pain, breast tenderness, and bleeding. However, ectopic pregnancy will have additional symptoms, such as: °· Pain with intercourse. °· Irregular vaginal bleeding or spotting. °· Cramping or pain on one side or in the lower abdomen. °· Fast heartbeat, low blood pressure, and sweating. °· Passing out while having a bowel movement. °Symptoms of a ruptured ectopic pregnancy and internal bleeding may include: °· Sudden, severe pain in the abdomen and pelvis. °· Dizziness, weakness, light-headedness, or fainting. °· Pain in the shoulder or neck area. °How is this diagnosed? °This condition is diagnosed by: °· A pelvic exam to locate pain or a mass in the abdomen. °· A pregnancy test. This blood test checks for the presence as well as the specific level of pregnancy hormone in the bloodstream. °· Ultrasound. This is performed if a pregnancy test is positive. In this test, a probe is inserted into the vagina. The probe will detect a fetus, possibly in a location other than the uterus. °· Taking a sample of uterus tissue (dilation and curettage, or D&C). °· Surgery to perform a visual exam of the inside of the abdomen using a thin, lighted tube that has a tiny camera on the end (laparoscope). °· Culdocentesis. This procedure involves inserting a needle at the   top of the vagina, behind the uterus. If blood is present in this area, it may indicate that a fallopian tube is torn. °How is this treated? °This condition is treated with medicine or surgery. °Medicine  °· An injection of a medicine (methotrexate) may be given to cause the pregnancy tissue to  be absorbed. This medicine may save your fallopian tube. It may be given if: °¨ The diagnosis is made early, with no signs of active bleeding. °¨ The fallopian tube has not ruptured. °¨ You are considered to be a good candidate for the medicine. °Usually, pregnancy hormone blood levels are checked after methotrexate treatment. This is to be sure that the medicine is effective. It may take 4-6 weeks for the pregnancy to be absorbed. Most pregnancies will be absorbed by 3 weeks. °Surgery  °· A laparoscope may be used to remove the pregnancy tissue. °· If severe internal bleeding occurs, a larger cut (incision) may be made in the lower abdomen (laparotomy) to remove the fetus and placenta. This is done to stop the bleeding. °· Part or all of the fallopian tube may be removed (salpingectomy) along with the fetus and placenta. The fallopian tube may also be repaired during the surgery. °· In very rare circumstances, removal of the uterus (hysterectomy) may be required. °· After surgery, pregnancy hormone testing may be done to be sure that there is no pregnancy tissue left. °Whether your treatment is medicine or surgery, you may receive a Rho (D) immune globulin shot to prevent problems with any future pregnancy. This shot may be given if: °· You are Rh-negative and the baby's father is Rh-positive. °· You are Rh-negative and you do not know the Rh type of the baby's father. °Follow these instructions at home: °· Rest and limit your activity after the procedure for as long as told by your health care provider. °· Until your health care provider says that it is safe: °¨ Do not lift anything that is heavier than 10 lb (4.5 kg), or the limit that your health care provider tells you. °¨ Avoid physical exercise and any movement that requires effort (is strenuous). °· To help prevent constipation: °¨ Eat a healthy diet that includes fruits, vegetables, and whole grains. °¨ Drink 6-8 glasses of water per day. °Get help right  away if: °· You develop worsening pain that is not relieved by medicine. °· You have: °¨ A fever or chills. °¨ Vaginal bleeding. °¨ Redness and swelling at the incision site. °¨ Nausea and vomiting. °· You feel dizzy or weak. °· You feel light-headed or you faint. °This information is not intended to replace advice given to you by your health care provider. Make sure you discuss any questions you have with your health care provider. °Document Released: 10/29/2004 Document Revised: 05/20/2016 Document Reviewed: 04/22/2016 °Elsevier Interactive Patient Education © 2017 [employer] ° °

## 2016-08-25 NOTE — MAU Note (Signed)
Pt here for day 4 post MTX labs.  Pt has LLQ pain, having small amount bleeding.

## 2016-08-31 ENCOUNTER — Inpatient Hospital Stay (HOSPITAL_COMMUNITY)
Admission: AD | Admit: 2016-08-31 | Discharge: 2016-08-31 | Disposition: A | Discharge status: Home / Self Care | Payer: Medicaid Other | Source: Ambulatory Visit | Attending: Obstetrics and Gynecology | Admitting: Obstetrics and Gynecology

## 2016-08-31 ENCOUNTER — Encounter (HOSPITAL_COMMUNITY): Payer: Self-pay | Admitting: *Deleted

## 2016-08-31 DIAGNOSIS — O009 Unspecified ectopic pregnancy without intrauterine pregnancy: Secondary | ICD-10-CM | POA: Diagnosis not present

## 2016-08-31 DIAGNOSIS — Z3A01 Less than 8 weeks gestation of pregnancy: Secondary | ICD-10-CM | POA: Insufficient documentation

## 2016-08-31 LAB — HCG, QUANTITATIVE, PREGNANCY: hCG, Beta Chain, Quant, S: 457 m[IU]/mL — ABNORMAL HIGH (ref <5)

## 2016-08-31 NOTE — MAU Note (Signed)
Pt presents to MAU for follow up QUANT post methotrexate injection. Denies any pain or vaginal bleeding

## 2016-08-31 NOTE — MAU Provider Note (Signed)
Ms. Wendy SchaumannMikalah Maddox  is a 25 y.o. G3P1011 at 7956w3d who [facility] for follow-up quant hCG after 48 hours. She is status post methotrexate on 08/22/16  HCG was 643  The patient was seen in MAU on 08/25/16 for Day4 labs  quant hCG of 641   She denies pain Denies vaginal bleeding  Denies fever    OB History  Gravida Para Term Preterm AB Living  3 1 1   1 1   SAB TAB Ectopic Multiple Live Births  1       1    # Outcome Date GA Lbr Len/2nd Weight Sex Delivery Anes PTL Lv  3 Gravida           2 SAB           1 Term     F Vag-Spont   LIV      Past Medical History:  Diagnosis Date  . Chlamydia   . Ovarian cyst   . Pregnancy induced hypertension      BP 120/62   Pulse 69   Temp 98.2 F (36.8 C)   Resp 16   LMP 07/31/2016   Breastfeeding? Unknown   CONSTITUTIONAL: Well-developed, well-nourished female in no acute distress.  MUSCULOSKELETAL: Normal range of motion.  CARDIOVASCULAR: Regular heart rate RESPIRATORY: Normal effort NEUROLOGICAL: Alert and oriented to person, place, and time.  SKIN: Skin is warm and dry. No rash noted. Not diaphoretic. No erythema. No pallor. PSYCH: Normal mood and affect. Normal behavior. Normal judgment and thought content.  Results for orders placed or performed during the hospital encounter of 08/31/16 (from the past 24 hour(s))  hCG, quantitative, pregnancy     Status: Abnormal   Collection Time: 08/31/16  4:57 PM  Result Value Ref Range   hCG, Beta Chain, Quant, S 457 (H) <5 mIU/mL    A: Ectopic Pregnancy Appropriate decrease in quant hCG   P: Discharge home ectopic precautions discussed Patient will return for follow-up HCG in 1 week. Discussed with Dr Alysia PennaErvin Patient may return to MAU as needed or if her condition were to change or worsen   Aviva SignsMarie L Williams, CNM 08/31/2016 6:01 PM

## 2016-08-31 NOTE — Discharge Instructions (Signed)
Methotrexate Treatment for an Ectopic Pregnancy, Care After °Refer to this sheet in the next few weeks. These instructions provide you with information on caring for yourself after your procedure. Your health care provider may also give you more specific instructions. Your treatment has been planned according to current medical practices, but problems sometimes occur. Call your health care provider if you have any problems or questions after your procedure. °WHAT TO EXPECT AFTER THE PROCEDURE °You may have some abdominal cramping, vaginal bleeding, and fatigue in the first few days after taking methotrexate. Some other possible side effects of methotrexate include: °· Nausea. °· Vomiting. °· Diarrhea. °· Mouth sores. °· Swelling or irritation of the lining of your lungs (pneumonitis). °· Liver damage. °· Hair loss. °HOME CARE INSTRUCTIONS  °After you have received the methotrexate medicine, you need to be careful of your activities and watch your condition for several weeks. It may take 1 week before your hormone levels return to normal. °· Keep all follow-up appointments as directed by your health care provider. °· Avoid traveling too far away from your health care provider. °· Do not have sexual intercourse until your health care provider says it is safe to do so. °· You may resume your usual diet. °· Limit strenuous activity. °· Do not take folic acid, prenatal vitamins, or other vitamins that contain folic acid. °· Do not take aspirin, ibuprofen, or naproxen (nonsteroidal anti-inflammatory drugs [NSAIDs]). °· Do not drink alcohol. °SEEK MEDICAL CARE IF:  °· You cannot control your nausea and vomiting. °· You cannot control your diarrhea. °· You have sores in your mouth and want treatment. °· You need pain medicine for your abdominal pain. °· You have a rash. °· You are having a reaction to the medicine. °SEEK IMMEDIATE MEDICAL CARE IF:  °· You have increasing abdominal or pelvic pain. °· You notice increased  bleeding. °· You feel light-headed, or you faint. °· You have shortness of breath. °· Your heart rate increases. °· You have a cough. °· You have chills. °· You have a fever. °This information is not intended to replace advice given to you by your health care provider. Make sure you discuss any questions you have with your health care provider. °Document Released: 09/10/2011 Document Revised: 09/26/2013 Document Reviewed: 07/10/2013 °Elsevier Interactive Patient Education © 2017 [employer] ° °

## 2016-09-09 ENCOUNTER — Other Ambulatory Visit: Payer: Medicaid Other

## 2016-10-05 NOTE — L&D Delivery Note (Signed)
Delivery Note At 9:24 AM a viable female was delivered via Vaginal, Spontaneous (Presentation:ROA ;  ).  APGAR: 8, 9; weight 4 lb 3 oz (1900 g).   Placenta status intact .: , .  Cord:  with the following complications: .    Anesthesia:  cle Episiotomy: noe  Lacerations:  none Suture Repair: n/a Est. Blood Loss (mL):  100cc  Pt progress quickly from 4 to c/c . Pushed a few times with delivery of a vigorous female over an intact perineum . Neonatal staff in room . After approx 20 second of baby on mom  Nursery staff requested cord clamping . Marland Kitchen. Placenta delivered shortly after . No malodor . Placenta will be sent to pathology  Mom to postpartum.  Baby to Nursery.  Ihor Austinhomas J Akeiba Axelson 09/09/2017, 9:41 AM

## 2016-10-19 ENCOUNTER — Emergency Department (HOSPITAL_COMMUNITY)
Admission: EM | Admit: 2016-10-19 | Discharge: 2016-10-19 | Disposition: A | Discharge status: Home / Self Care | Payer: Medicaid Other | Attending: Emergency Medicine | Admitting: Emergency Medicine

## 2016-10-19 ENCOUNTER — Emergency Department (HOSPITAL_COMMUNITY): Payer: Medicaid Other

## 2016-10-19 ENCOUNTER — Encounter (HOSPITAL_COMMUNITY): Payer: Self-pay | Admitting: Emergency Medicine

## 2016-10-19 DIAGNOSIS — R509 Fever, unspecified: Secondary | ICD-10-CM | POA: Insufficient documentation

## 2016-10-19 DIAGNOSIS — J111 Influenza due to unidentified influenza virus with other respiratory manifestations: Secondary | ICD-10-CM

## 2016-10-19 DIAGNOSIS — Z87891 Personal history of nicotine dependence: Secondary | ICD-10-CM | POA: Diagnosis not present

## 2016-10-19 DIAGNOSIS — R69 Illness, unspecified: Secondary | ICD-10-CM

## 2016-10-19 DIAGNOSIS — R05 Cough: Secondary | ICD-10-CM | POA: Diagnosis present

## 2016-10-19 MED ORDER — ACETAMINOPHEN 500 MG PO TABS
1000.0000 mg | ORAL_TABLET | Freq: Once | ORAL | Status: AC
  Administered 2016-10-19: 1000 mg via ORAL
  Filled 2016-10-19: qty 2

## 2016-10-19 MED ORDER — IBUPROFEN 800 MG PO TABS: 800.0000 mg | ORAL_TABLET | Freq: Three times a day (TID) | ORAL | 0 refills | Status: DC

## 2016-10-19 MED ORDER — BENZONATATE 100 MG PO CAPS: 100.0000 mg | ORAL_CAPSULE | Freq: Three times a day (TID) | ORAL | 0 refills | Status: DC | PRN

## 2016-10-19 MED ORDER — IBUPROFEN 800 MG PO TABS
800.0000 mg | ORAL_TABLET | Freq: Once | ORAL | Status: AC
Start: 2016-10-19 — End: 2016-10-19
  Administered 2016-10-19: 800 mg via ORAL
  Filled 2016-10-19: qty 1

## 2016-10-19 MED ORDER — OSELTAMIVIR PHOSPHATE 75 MG PO CAPS: 75.0000 mg | ORAL_CAPSULE | Freq: Two times a day (BID) | ORAL | 0 refills | Status: DC

## 2016-10-19 NOTE — ED Notes (Signed)
Pt verbalized understanding discharge instructions and denies any further needs or questions at this time. VS stable, ambulatory and steady gait.   

## 2016-10-19 NOTE — ED Provider Notes (Signed)
[facility]-EMERGENCY DEPT Provider Note   CSN: [id number] Arrival date & time: 10/19/16  1407     History   Chief Complaint Chief Complaint  Patient presents with  . Cough  . Generalized Body Aches    HPI Wendy Maddox is a 27 y.o. female.  HPI Patient presents with 2 days of sudden onset, constant, moderate, unchanging cough with associated fever, chills, myalgias, nasal congestion, and scratchy/sore throat.  She took NyQuil last night with some relief.  Her daughter is sick with the flu.  She is concerned she has the flu as well.  No CP, AMS, SOB, or abdominal pain.  She reports 4 total episodes of vomiting over the last 2 days.  She has been tolerating fluids without difficulty.  Past Medical History:  Diagnosis Date  . Chlamydia   . Ovarian cyst   . Pregnancy induced hypertension     There are no active problems to display for this patient.   Past Surgical History:  Procedure Laterality Date  . NO PAST SURGERIES      OB History    Gravida Para Term Preterm AB Living   3 1 1   1 1    SAB TAB Ectopic Multiple Live Births   1       1       Home Medications    Prior to Admission medications   Medication Sig Start Date End Date Taking? Authorizing Provider  acetaminophen (TYLENOL) 500 MG tablet Take 1,000 mg by mouth every 6 (six) hours as needed for mild pain, moderate pain or headache.    Historical Provider, MD  benzonatate (TESSALON) 100 MG capsule Take 1 capsule (100 mg total) by mouth 3 (three) times daily as needed for cough. 10/19/16   Cheri Fowler, PA-C  ibuprofen (ADVIL,MOTRIN) 800 MG tablet Take 1 tablet (800 mg total) by mouth 3 (three) times daily. 10/19/16   Cheri Fowler, PA-C  oseltamivir (TAMIFLU) 75 MG capsule Take 1 capsule (75 mg total) by mouth 2 (two) times daily. 10/19/16   Cheri Fowler, PA-C  traMADol (ULTRAM) 50 MG tablet Take 1 tablet (50 mg total) by mouth every 6 (six) hours as needed. 08/25/16   Judeth Horn, NP    Family History Family History    Problem Relation Age of Onset  . Diabetes Mother   . Asthma Father   . Asthma Daughter   . Diabetes Maternal Grandmother   . Diabetes Paternal Grandmother     Social History Social History  Substance Use Topics  . Smoking status: Former Smoker    Packs/day: 0.25    Types: Cigarettes  . Smokeless tobacco: Never Used     Comment: Oct 2017  . Alcohol use Yes     Comment: every other week     Allergies   Patient has no known allergies.   Review of Systems Review of Systems All other systems negative unless otherwise stated in HPI   Physical Exam Updated Vital Signs BP 122/81 (BP Location: Left Arm)   Pulse 105   Temp 99.7 F (37.6 C) (Oral)   Resp 18   Ht 5\' 1"  (1.549 m)   Wt 54.4 kg   LMP 10/19/2016   SpO2 100%   BMI 22.67 kg/m   Physical Exam  Constitutional: She is oriented to person, place, and time. She appears well-developed and well-nourished.  Non-toxic appearance. She does not have a sickly appearance. She does not appear ill.  HENT:  Head: Normocephalic and atraumatic.  Mouth/Throat:  Oropharynx is clear and moist.  Eyes: Conjunctivae are normal. Pupils are equal, round, and reactive to light.  Neck: Normal range of motion. Neck supple.  Cardiovascular: Normal rate and regular rhythm.   Pulmonary/Chest: Effort normal and breath sounds normal. No accessory muscle usage or stridor. No respiratory distress. She has no wheezes. She has no rhonchi. She has no rales.  Abdominal: Soft. Bowel sounds are normal. She exhibits no distension. There is no tenderness.  Musculoskeletal: Normal range of motion.  Lymphadenopathy:    She has no cervical adenopathy.  Neurological: She is alert and oriented to person, place, and time.  Speech clear without dysarthria.  Skin: Skin is warm and dry.  Psychiatric: She has a normal mood and affect. Her behavior is normal.     ED Treatments / Results  Labs (all labs ordered are listed, but only abnormal results are  displayed) Labs Reviewed - No data to display  EKG  EKG Interpretation None       Radiology Dg Chest 2 View  Result Date: 10/19/2016 CLINICAL DATA:  Cough for 2 days with fever today. EXAM: CHEST  2 VIEW COMPARISON:  None. FINDINGS: The heart size and mediastinal contours are within normal limits. Both lungs are clear. The visualized skeletal structures are unremarkable. IMPRESSION: No active cardiopulmonary disease.  No evidence of pneumonia. Electronically Signed   By: Bary RichardStan  Maynard M.D.   On: 10/19/2016 18:12    Procedures Procedures (including critical care time)  Medications Ordered in ED Medications  ibuprofen (ADVIL,MOTRIN) tablet 800 mg (800 mg Oral Given 10/19/16 1637)  acetaminophen (TYLENOL) tablet 1,000 mg (1,000 mg Oral Given 10/19/16 1637)     Initial Impression / Assessment and Plan / ED Course  I have reviewed the triage vital signs and the nursing notes.  Pertinent labs & imaging results that were available during my care of the patient were reviewed by me and considered in my medical decision making (see chart for details).  Clinical Course    Patient with likely influenza. Chest x-ray negative for pneumonia. Patient is still within the Tamiflu window. Discussed symptomatic treatment with ibuprofen, Tylenol, and Tessalon Perles. Rest adequate hydration. I have low suspicion for systemic bacteremia. Discussed return precautions. Stable for discharge.  Final Clinical Impressions(s) / ED Diagnoses   Final diagnoses:  Influenza-like illness    New Prescriptions New Prescriptions   BENZONATATE (TESSALON) 100 MG CAPSULE    Take 1 capsule (100 mg total) by mouth 3 (three) times daily as needed for cough.   IBUPROFEN (ADVIL,MOTRIN) 800 MG TABLET    Take 1 tablet (800 mg total) by mouth 3 (three) times daily.   OSELTAMIVIR (TAMIFLU) 75 MG CAPSULE    Take 1 capsule (75 mg total) by mouth 2 (two) times daily.     Cheri FowlerKayla Addalynne Golding, PA-C 10/19/16 1831    Shaune Pollackameron  Isaacs, MD 10/21/16 [phone]

## 2016-10-19 NOTE — Discharge Instructions (Signed)
Drink 8 glasses of water or gatorade a day to stay hydrated.  Take 800 mg ibuprofen for fever and pain.  Take 1000 mg of Tylenol every 6 hours for fever and pain.  Take Tamiflu for likely influenza.  Take tessalon perles for cough.  Return to the ED for any new or concerning symptoms.

## 2016-10-19 NOTE — ED Triage Notes (Signed)
Cough and body aches x 2 days  Has taken some OTC meds and it did help

## 2017-02-24 ENCOUNTER — Inpatient Hospital Stay (HOSPITAL_COMMUNITY)
Admission: AD | Admit: 2017-02-24 | Discharge: 2017-02-24 | Disposition: A | Discharge status: Home / Self Care | Payer: Medicaid Other | Source: Ambulatory Visit | Attending: Obstetrics and Gynecology | Admitting: Obstetrics and Gynecology

## 2017-02-24 ENCOUNTER — Encounter (HOSPITAL_COMMUNITY): Payer: Self-pay | Admitting: *Deleted

## 2017-02-24 ENCOUNTER — Inpatient Hospital Stay (HOSPITAL_COMMUNITY): Payer: Medicaid Other

## 2017-02-24 DIAGNOSIS — Z3491 Encounter for supervision of normal pregnancy, unspecified, first trimester: Secondary | ICD-10-CM

## 2017-02-24 DIAGNOSIS — B9689 Other specified bacterial agents as the cause of diseases classified elsewhere: Secondary | ICD-10-CM | POA: Diagnosis not present

## 2017-02-24 DIAGNOSIS — O26891 Other specified pregnancy related conditions, first trimester: Secondary | ICD-10-CM | POA: Insufficient documentation

## 2017-02-24 DIAGNOSIS — Z3A01 Less than 8 weeks gestation of pregnancy: Secondary | ICD-10-CM | POA: Insufficient documentation

## 2017-02-24 DIAGNOSIS — N760 Acute vaginitis: Secondary | ICD-10-CM | POA: Insufficient documentation

## 2017-02-24 DIAGNOSIS — R109 Unspecified abdominal pain: Secondary | ICD-10-CM | POA: Diagnosis present

## 2017-02-24 DIAGNOSIS — Z833 Family history of diabetes mellitus: Secondary | ICD-10-CM | POA: Diagnosis not present

## 2017-02-24 DIAGNOSIS — O9989 Other specified diseases and conditions complicating pregnancy, childbirth and the puerperium: Secondary | ICD-10-CM

## 2017-02-24 DIAGNOSIS — Z87891 Personal history of nicotine dependence: Secondary | ICD-10-CM | POA: Insufficient documentation

## 2017-02-24 DIAGNOSIS — O219 Vomiting of pregnancy, unspecified: Secondary | ICD-10-CM | POA: Diagnosis not present

## 2017-02-24 DIAGNOSIS — Z825 Family history of asthma and other chronic lower respiratory diseases: Secondary | ICD-10-CM | POA: Diagnosis not present

## 2017-02-24 DIAGNOSIS — O26899 Other specified pregnancy related conditions, unspecified trimester: Secondary | ICD-10-CM

## 2017-02-24 DIAGNOSIS — O23591 Infection of other part of genital tract in pregnancy, first trimester: Secondary | ICD-10-CM | POA: Insufficient documentation

## 2017-02-24 LAB — CBC
HCT: 37.2 % (ref 36.0–46.0)
Hemoglobin: 12.4 g/dL (ref 12.0–15.0)
MCH: 25.1 pg — AB (ref 26.0–34.0)
MCHC: 33.3 g/dL (ref 30.0–36.0)
MCV: 75.2 fL — AB (ref 78.0–100.0)
PLATELETS: 274 10*3/uL (ref 150–400)
RBC: 4.95 MIL/uL (ref 3.87–5.11)
RDW: 15.4 % (ref 11.5–15.5)
WBC: 8.8 10*3/uL (ref 4.0–10.5)

## 2017-02-24 LAB — URINALYSIS, ROUTINE W REFLEX MICROSCOPIC
BILIRUBIN URINE: NEGATIVE
GLUCOSE, UA: NEGATIVE mg/dL
Hgb urine dipstick: NEGATIVE
KETONES UR: NEGATIVE mg/dL
Leukocytes, UA: NEGATIVE
NITRITE: NEGATIVE
PH: 6 (ref 5.0–8.0)
PROTEIN: NEGATIVE mg/dL
Specific Gravity, Urine: 1.01 (ref 1.005–1.030)

## 2017-02-24 LAB — WET PREP, GENITAL
Sperm: NONE SEEN
TRICH WET PREP: NONE SEEN
Yeast Wet Prep HPF POC: NONE SEEN

## 2017-02-24 LAB — POCT PREGNANCY, URINE: Preg Test, Ur: POSITIVE — AB

## 2017-02-24 LAB — HCG, QUANTITATIVE, PREGNANCY: HCG, BETA CHAIN, QUANT, S: 4787 m[IU]/mL — AB (ref <5)

## 2017-02-24 LAB — OB RESULTS CONSOLE GC/CHLAMYDIA: Gonorrhea: NEGATIVE

## 2017-02-24 MED ORDER — PROMETHAZINE HCL 25 MG PO TABS: 25.0000 mg | ORAL_TABLET | Freq: Four times a day (QID) | ORAL | 0 refills | Status: DC | PRN

## 2017-02-24 MED ORDER — METRONIDAZOLE 500 MG PO TABS: 500.0000 mg | ORAL_TABLET | Freq: Two times a day (BID) | ORAL | 0 refills | Status: DC

## 2017-02-24 NOTE — Discharge Instructions (Signed)
First Trimester of Pregnancy °The first trimester of pregnancy is from week 1 until the end of week 13 (months 1 through 3). During this time, your baby will begin to develop inside you. At 6-8 weeks, the eyes and face are formed, and the heartbeat can be seen on ultrasound. At the end of 12 weeks, all the baby's organs are formed. Prenatal care is all the medical care you receive before the birth of your baby. Make sure you get good prenatal care and follow all of your doctor's instructions. °Follow these instructions at home: °Medicines °· Take over-the-counter and prescription medicines only as told by your doctor. Some medicines are safe and some medicines are not safe during pregnancy. °· Take a prenatal vitamin that contains at least 600 micrograms (mcg) of folic acid. °· If you have trouble pooping (constipation), take medicine that will make your stool soft (stool softener) if your doctor approves. °Eating and drinking °· Eat regular, healthy meals. °· Your doctor will tell you the amount of weight gain that is right for you. °· Avoid raw meat and uncooked cheese. °· If you feel sick to your stomach (nauseous) or throw up (vomit): °? Eat 4 or 5 small meals a day instead of 3 large meals. °? Try eating a few soda crackers. °? Drink liquids between meals instead of during meals. °· To prevent constipation: °? Eat foods that are high in fiber, like fresh fruits and vegetables, whole grains, and beans. °? Drink enough fluids to keep your pee (urine) clear or pale yellow. °Activity °· Exercise only as told by your doctor. Stop exercising if you have cramps or pain in your lower belly (abdomen) or low back. °· Do not exercise if it is too hot, too humid, or if you are in a place of great height (high altitude). °· Try to avoid standing for long periods of time. Move your legs often if you must stand in one place for a long time. °· Avoid heavy lifting. °· Wear low-heeled shoes. Sit and stand up straight. °· You  can have sex unless your doctor tells you not to. °Relieving pain and discomfort °· Wear a good support bra if your breasts are sore. °· Take warm water baths (sitz baths) to soothe pain or discomfort caused by hemorrhoids. Use hemorrhoid cream if your doctor says it is okay. °· Rest with your legs raised if you have leg cramps or low back pain. °· If you have puffy, bulging veins (varicose veins) in your legs: °? Wear support hose or compression stockings as told by your doctor. °? Raise (elevate) your feet for 15 minutes, 3-4 times a day. °? Limit salt in your food. °Prenatal care °· Schedule your prenatal visits by the twelfth week of pregnancy. °· Write down your questions. Take them to your prenatal visits. °· Keep all your prenatal visits as told by your doctor. This is important. °Safety °· Wear your seat belt at all times when driving. °· Make a list of emergency phone numbers. The list should include numbers for family, friends, the hospital, and police and fire departments. °General instructions °· Ask your doctor for a referral to a local prenatal class. Begin classes no later than at the start of month 6 of your pregnancy. °· Ask for help if you need counseling or if you need help with nutrition. Your doctor can give you advice or tell you where to go for help. °· Do not use hot tubs, steam rooms, or   saunas. °· Do not douche or use tampons or scented sanitary pads. °· Do not cross your legs for long periods of time. °· Avoid all herbs and alcohol. Avoid drugs that are not approved by your doctor. °· Do not use any tobacco products, including cigarettes, chewing tobacco, and electronic cigarettes. If you need help quitting, ask your doctor. You may get counseling or other support to help you quit. °· Avoid cat litter boxes and soil used by cats. These carry germs that can cause birth defects in the baby and can cause a loss of your baby (miscarriage) or stillbirth. °· Visit your dentist. At home, brush  your teeth with a soft toothbrush. Be gentle when you floss. °Contact a doctor if: °· You are dizzy. °· You have mild cramps or pressure in your lower belly. °· You have a nagging pain in your belly area. °· You continue to feel sick to your stomach, you throw up, or you have watery poop (diarrhea). °· You have a bad smelling fluid coming from your vagina. °· You have pain when you pee (urinate). °· You have increased puffiness (swelling) in your face, hands, legs, or ankles. °Get help right away if: °· You have a fever. °· You are leaking fluid from your vagina. °· You have spotting or bleeding from your vagina. °· You have very bad belly cramping or pain. °· You gain or lose weight rapidly. °· You throw up blood. It may look like coffee grounds. °· You are around people who have German measles, fifth disease, or chickenpox. °· You have a very bad headache. °· You have shortness of breath. °· You have any kind of trauma, such as from a fall or a car accident. °Summary °· The first trimester of pregnancy is from week 1 until the end of week 13 (months 1 through 3). °· To take care of yourself and your unborn baby, you will need to eat healthy meals, take medicines only if your doctor tells you to do so, and do activities that are safe for you and your baby. °· Keep all follow-up visits as told by your doctor. This is important as your doctor will have to ensure that your baby is healthy and growing well. °This information is not intended to replace advice given to you by your health care provider. Make sure you discuss any questions you have with your health care provider. °Document Released: 03/09/2008 Document Revised: 09/29/2016 Document Reviewed: 09/29/2016 °[employer]vier Interactive Patient Education © 2017 [employer] °Bacterial Vaginosis °Bacterial vaginosis is a vaginal infection that occurs when the normal balance of bacteria in the vagina is disrupted. It results from an overgrowth of certain bacteria. This  is the most common vaginal infection among women ages 15-44. °Because bacterial vaginosis increases your risk for STIs (sexually transmitted infections), getting treated can help reduce your risk for chlamydia, gonorrhea, herpes, and HIV (human immunodeficiency virus). Treatment is also important for preventing complications in pregnant women, because this condition can cause an early (premature) delivery. °What are the causes? °This condition is caused by an increase in harmful bacteria that are normally present in small amounts in the vagina. However, the reason that the condition develops is not fully understood. °What increases the risk? °The following factors may make you more likely to develop this condition: °· Having a new sexual partner or multiple sexual partners. °· Having unprotected sex. °· Douching. °· Having an intrauterine device (IUD). °· Smoking. °· Drug and alcohol abuse. °· Taking   certain antibiotic medicines. °· Being pregnant. ° °You cannot get bacterial vaginosis from toilet seats, bedding, swimming pools, or contact with objects around you. °What are the signs or symptoms? °Symptoms of this condition include: °· Grey or white vaginal discharge. The discharge can also be watery or foamy. °· A fish-like odor with discharge, especially after sexual intercourse or during menstruation. °· Itching in and around the vagina. °· Burning or pain with urination. ° °Some women with bacterial vaginosis have no signs or symptoms. °How is this diagnosed? °This condition is diagnosed based on: °· Your medical history. °· A physical exam of the vagina. °· Testing a sample of vaginal fluid under a microscope to look for a large amount of bad bacteria or abnormal cells. Your health care provider may use a cotton swab or a small wooden spatula to collect the sample. ° °How is this treated? °This condition is treated with antibiotics. These may be given as a pill, a vaginal cream, or a medicine that is put into  the vagina (suppository). If the condition comes back after treatment, a second round of antibiotics may be needed. °Follow these instructions at home: °Medicines °· Take over-the-counter and prescription medicines only as told by your health care provider. °· Take or use your antibiotic as told by your health care provider. Do not stop taking or using the antibiotic even if you start to feel better. °General instructions °· If you have a female sexual partner, tell her that you have a vaginal infection. She should see her health care provider and be treated if she has symptoms. If you have a female sexual partner, he does not need treatment. °· During treatment: °? Avoid sexual activity until you finish treatment. °? Do not douche. °? Avoid alcohol as directed by your health care provider. °? Avoid breastfeeding as directed by your health care provider. °· Drink enough water and fluids to keep your urine clear or pale yellow. °· Keep the area around your vagina and rectum clean. °? Wash the area daily with warm water. °? Wipe yourself from front to back after using the toilet. °· Keep all follow-up visits as told by your health care provider. This is important. °How is this prevented? °· Do not douche. °· Wash the outside of your vagina with warm water only. °· Use protection when having sex. This includes latex condoms and dental dams. °· Limit how many sexual partners you have. To help prevent bacterial vaginosis, it is best to have sex with just one partner (monogamous). °· Make sure you and your sexual partner are tested for STIs. °· Wear cotton or cotton-lined underwear. °· Avoid wearing tight pants and pantyhose, especially during summer. °· Limit the amount of alcohol that you drink. °· Do not use any products that contain nicotine or tobacco, such as cigarettes and e-cigarettes. If you need help quitting, ask your health care provider. °· Do not use illegal drugs. °Where to find more information: °· Centers  for Disease Control and Prevention: www.cdc.gov/std °· American Sexual Health Association (ASHA): www.ashastd.org °· [place] Department of Health and Human Services, Office on Women's Health: www.womenshealth.gov/ or https://www.womenshealth.gov/a-z-topics/bacterial-vaginosis °Contact a health care provider if: °· Your symptoms do not improve, even after treatment. °· You have more discharge or pain when urinating. °· You have a fever. °· You have pain in your abdomen. °· You have pain during sex. °· You have vaginal bleeding between periods. °Summary °· Bacterial vaginosis is a vaginal infection that occurs when   the normal balance of bacteria in the vagina is disrupted. °· Because bacterial vaginosis increases your risk for STIs (sexually transmitted infections), getting treated can help reduce your risk for chlamydia, gonorrhea, herpes, and HIV (human immunodeficiency virus). Treatment is also important for preventing complications in pregnant women, because the condition can cause an early (premature) delivery. °· This condition is treated with antibiotic medicines. These may be given as a pill, a vaginal cream, or a medicine that is put into the vagina (suppository). °This information is not intended to replace advice given to you by your health care provider. Make sure you discuss any questions you have with your health care provider. °Document Released: 09/21/2005 Document Revised: 06/06/2016 Document Reviewed: 06/06/2016 °[employer] Interactive Patient Education © 2017 [employer] ° °

## 2017-02-24 NOTE — MAU Provider Note (Signed)
History     CSN: [id number]  Arrival date and time: 02/24/17 1507  First Provider Initiated Contact with Patient 02/24/17 1857      Chief Complaint  Patient presents with  . Abdominal Pain  . Nausea  . Vaginal Discharge   HPI Wendy Maddox is a 26 y.o. G4P1021 at [redacted]w[redacted]d by LMP who presents with abdominal pain, nausea, & vaginal discharge. Reports intermittent sharp lower abdominal pain x 3 days. Pain occurs sporadically in the LLQ. Currently no pain. Concerned d/t history of ectopic pregnancy 3 months ago (treated with MTX). Also reports nausea, no vomiting. Vaginal discharge is thick, white, and malodorous; no irritation or itching. Denies diarrhea, constipation, dysuria, or vaginal bleeding.   OB History    Gravida Para Term Preterm AB Living   4 1 1   2 1    SAB TAB Ectopic Multiple Live Births   1   1   1       Past Medical History:  Diagnosis Date  . Chlamydia   . Ovarian cyst   . Pregnancy induced hypertension     Past Surgical History:  Procedure Laterality Date  . NO PAST SURGERIES      Family History  Problem Relation Age of Onset  . Diabetes Mother   . Asthma Father   . Asthma Daughter   . Diabetes Maternal Grandmother   . Diabetes Paternal Grandmother     Social History  Substance Use Topics  . Smoking status: Former Smoker    Packs/day: 0.25    Types: Cigarettes  . Smokeless tobacco: Never Used     Comment: Oct 2017  . Alcohol use Yes     Comment: every other week    Allergies: No Known Allergies  Prescriptions Prior to Admission  Medication Sig Dispense Refill Last Dose  . acetaminophen (TYLENOL) 500 MG tablet Take 1,000 mg by mouth every 6 (six) hours as needed for mild pain, moderate pain or headache.   08/25/2016 at Unknown time  . benzonatate (TESSALON) 100 MG capsule Take 1 capsule (100 mg total) by mouth 3 (three) times daily as needed for cough. 21 capsule 0   . ibuprofen (ADVIL,MOTRIN) 800 MG tablet Take 1 tablet (800 mg total) by  mouth 3 (three) times daily. 21 tablet 0   . oseltamivir (TAMIFLU) 75 MG capsule Take 1 capsule (75 mg total) by mouth 2 (two) times daily. 10 capsule 0   . traMADol (ULTRAM) 50 MG tablet Take 1 tablet (50 mg total) by mouth every 6 (six) hours as needed. 15 tablet 0     Review of Systems  Constitutional: Negative.   Gastrointestinal: Positive for abdominal pain and nausea. Negative for constipation, diarrhea and vomiting.  Genitourinary: Positive for vaginal discharge. Negative for dysuria and vaginal bleeding.   Physical Exam   Blood pressure 111/62, pulse 63, temperature 98.5 F (36.9 C), temperature source Oral, resp. rate 18, weight 112 lb (50.8 kg), last menstrual period 01/19/2017, SpO2 100 %, unknown if currently breastfeeding.  Physical Exam  Nursing note and vitals reviewed. Constitutional: She is oriented to person, place, and time. She appears well-developed and well-nourished. No distress.  HENT:  Head: Normocephalic and atraumatic.  Eyes: Conjunctivae are normal. Right eye exhibits no discharge. Left eye exhibits no discharge. No scleral icterus.  Neck: Normal range of motion.  Cardiovascular: Normal rate, regular rhythm and normal heart sounds.   No murmur heard. Respiratory: Effort normal and breath sounds normal. No respiratory distress. She has no wheezes.  GI: Soft. Bowel sounds are normal. She exhibits no distension. There is no tenderness. There is no rebound.  Genitourinary: Uterus normal. Cervix exhibits no motion tenderness. Right adnexum displays no mass and no tenderness. Left adnexum displays no mass and no tenderness.  Neurological: She is alert and oriented to person, place, and time.  Skin: Skin is warm and dry. She is not diaphoretic.  Psychiatric: She has a normal mood and affect. Her behavior is normal. Judgment and thought content normal.    MAU Course  Procedures Results for orders placed or performed during the hospital encounter of 02/24/17 (from  the past 24 hour(s))  Urinalysis, Routine w reflex microscopic     Status: None   Collection Time: 02/24/17  3:43 PM  Result Value Ref Range   Color, Urine YELLOW YELLOW   APPearance CLEAR CLEAR   Specific Gravity, Urine 1.010 1.005 - 1.030   pH 6.0 5.0 - 8.0   Glucose, UA NEGATIVE NEGATIVE mg/dL   Hgb urine dipstick NEGATIVE NEGATIVE   Bilirubin Urine NEGATIVE NEGATIVE   Ketones, ur NEGATIVE NEGATIVE mg/dL   Protein, ur NEGATIVE NEGATIVE mg/dL   Nitrite NEGATIVE NEGATIVE   Leukocytes, UA NEGATIVE NEGATIVE  Pregnancy, urine POC     Status: Abnormal   Collection Time: 02/24/17  4:43 PM  Result Value Ref Range   Preg Test, Ur POSITIVE (A) NEGATIVE  CBC     Status: Abnormal   Collection Time: 02/24/17  7:08 PM  Result Value Ref Range   WBC 8.8 4.0 - 10.5 K/uL   RBC 4.95 3.87 - 5.11 MIL/uL   Hemoglobin 12.4 12.0 - 15.0 g/dL   HCT 16.137.2 09.636.0 - 04.546.0 %   MCV 75.2 (L) 78.0 - 100.0 fL   MCH 25.1 (L) 26.0 - 34.0 pg   MCHC 33.3 30.0 - 36.0 g/dL   RDW 40.915.4 81.111.5 - 91.415.5 %   Platelets 274 150 - 400 K/uL  hCG, quantitative, pregnancy     Status: Abnormal   Collection Time: 02/24/17  7:08 PM  Result Value Ref Range   hCG, Beta Chain, Quant, S 4,787 (H) <5 mIU/mL  Wet prep, genital     Status: Abnormal   Collection Time: 02/24/17  7:10 PM  Result Value Ref Range   Yeast Wet Prep HPF POC NONE SEEN NONE SEEN   Trich, Wet Prep NONE SEEN NONE SEEN   Clue Cells Wet Prep HPF POC PRESENT (A) NONE SEEN   WBC, Wet Prep HPF POC FEW (A) NONE SEEN   Sperm NONE SEEN    [place] Ob Comp Less 14 Wks  Result Date: 02/24/2017 CLINICAL DATA:  Pelvic pain. Personal history of ectopic pregnancy. Positive pregnancy test. First-trimester pregnancy. EXAM: OBSTETRIC <14 WK US AND TRANSVAGINAL OB US TECHNIQUE: Both transabdominal and transvaginal ultrasound examinations were performed for complete evaluation of the gestation as well as the maternal uterus, adnexal regions, and pelvic cul-de-sac. Transvaginal technique  was performed to assess early pregnancy. COMPARISON:  None for this pregnancy. Follow-up ultrasound 08/25/2016. FINDINGS: Intrauterine gestational sac: Present Yolk sac:  Present Embryo:  Not visualized. Cardiac Activity: Not visualized. MSD: 6.9  mm   5 w   2  d Subchorionic hemorrhage:  None visualized. Maternal uterus/adnexae: A corpus luteal cyst is noted within the right ovary. The uterus and adnexa are otherwise unremarkable. There is no significant free fluid. IMPRESSION: 1. Gestational sac and yolk sac compatible with early pregnancy. Embryo is not yet visualized. 2. No sonographic evidence for complication.  Electronically Signed   By: Marin Roberts M.D.   On: 02/24/2017 19:52   US Ob Transvaginal  Result Date: 02/24/2017 CLINICAL DATA:  Pelvic pain. Personal history of ectopic pregnancy. Positive pregnancy test. First-trimester pregnancy. EXAM: OBSTETRIC <14 WK [place] AND TRANSVAGINAL OB US TECHNIQUE: Both transabdominal and transvaginal ultrasound examinations were performed for complete evaluation of the gestation as well as the maternal uterus, adnexal regions, and pelvic cul-de-sac. Transvaginal technique was performed to assess early pregnancy. COMPARISON:  None for this pregnancy. Follow-up ultrasound 08/25/2016. FINDINGS: Intrauterine gestational sac: Present Yolk sac:  Present Embryo:  Not visualized. Cardiac Activity: Not visualized. MSD: 6.9  mm   5 w   2  d Subchorionic hemorrhage:  None visualized. Maternal uterus/adnexae: A corpus luteal cyst is noted within the right ovary. The uterus and adnexa are otherwise unremarkable. There is no significant free fluid. IMPRESSION: 1. Gestational sac and yolk sac compatible with early pregnancy. Embryo is not yet visualized. 2. No sonographic evidence for complication. Electronically Signed   By: Marin Roberts M.D.   On: 02/24/2017 19:52    MDM +UPT UA, wet prep, GC/chlamydia, CBC, ABO/Rh, quant hCG, HIV, and [place] today to rule out  ectopic pregnancy O positive Ultrasound shows IUGS with yolk sac Assessment and Plan  A: 1. Normal IUP (intrauterine pregnancy) on prenatal ultrasound, first trimester   2. Abdominal pain affecting pregnancy   3. BV (bacterial vaginosis)   4. Nausea and vomiting during pregnancy prior to [redacted] weeks gestation    P: Discharge home Rx flagyl & phenergan Discussed reasons to return to [facility]U Outpatient ultrasound ordered for viability Keep follow up appointment with OB/PCP  GC/CT pending   Judeth Horn 02/24/2017, 6:57 PM

## 2017-02-24 NOTE — MAU Note (Signed)
+   lower abdominal pain--started 3 days ago, pain comes and goes. Rating pain 7/10. Has not taken anything for the pain. Patient expresses fear as stated she previously had an ectopic pregnancy and unsure what to take for pain. Denies vaginal bleeding.  + vaginal discharge--white, thick, odorous --noticed for past 3 days.  LMP 01/19/17

## 2017-02-25 LAB — GC/CHLAMYDIA PROBE AMP (~~LOC~~) NOT AT ARMC
Chlamydia: NEGATIVE
NEISSERIA GONORRHEA: NEGATIVE

## 2017-02-25 LAB — HIV ANTIBODY (ROUTINE TESTING W REFLEX): HIV Screen 4th Generation wRfx: NONREACTIVE

## 2017-03-02 ENCOUNTER — Ambulatory Visit: Payer: Self-pay | Admitting: General Practice

## 2017-03-02 ENCOUNTER — Ambulatory Visit (HOSPITAL_COMMUNITY)
Admission: RE | Admit: 2017-03-02 | Discharge: 2017-03-02 | Disposition: A | Discharge status: Home / Self Care | Payer: Medicaid Other | Source: Ambulatory Visit | Attending: Student | Admitting: Student

## 2017-03-02 DIAGNOSIS — R109 Unspecified abdominal pain: Secondary | ICD-10-CM | POA: Diagnosis present

## 2017-03-02 DIAGNOSIS — Z3491 Encounter for supervision of normal pregnancy, unspecified, first trimester: Secondary | ICD-10-CM

## 2017-03-02 DIAGNOSIS — O26899 Other specified pregnancy related conditions, unspecified trimester: Secondary | ICD-10-CM

## 2017-03-02 DIAGNOSIS — Z3A01 Less than 8 weeks gestation of pregnancy: Secondary | ICD-10-CM | POA: Diagnosis not present

## 2017-03-02 DIAGNOSIS — Z712 Person consulting for explanation of examination or test findings: Secondary | ICD-10-CM

## 2017-03-02 NOTE — Progress Notes (Signed)
Patient is here for viability results today. Per Dorathy KinsmanVirginia Smith, ultrasound results are normal & patient should begin PNV & care. Informed patient of results & provided pictures. Patient states she is only taking PNV & medication for nausea. Patient reports she already has a prenatal appt scheduled elsewhere. Patient had no questions

## 2017-03-07 ENCOUNTER — Encounter (HOSPITAL_COMMUNITY): Payer: Self-pay

## 2017-03-07 ENCOUNTER — Inpatient Hospital Stay (HOSPITAL_COMMUNITY)
Admission: AD | Admit: 2017-03-07 | Discharge: 2017-03-07 | Disposition: A | Discharge status: Home / Self Care | Payer: Medicaid Other | Source: Ambulatory Visit | Attending: Obstetrics and Gynecology | Admitting: Obstetrics and Gynecology

## 2017-03-07 DIAGNOSIS — R51 Headache: Secondary | ICD-10-CM | POA: Insufficient documentation

## 2017-03-07 DIAGNOSIS — Z825 Family history of asthma and other chronic lower respiratory diseases: Secondary | ICD-10-CM | POA: Diagnosis not present

## 2017-03-07 DIAGNOSIS — O210 Mild hyperemesis gravidarum: Secondary | ICD-10-CM | POA: Diagnosis not present

## 2017-03-07 DIAGNOSIS — R42 Dizziness and giddiness: Secondary | ICD-10-CM | POA: Insufficient documentation

## 2017-03-07 DIAGNOSIS — Z3A01 Less than 8 weeks gestation of pregnancy: Secondary | ICD-10-CM | POA: Insufficient documentation

## 2017-03-07 DIAGNOSIS — O26891 Other specified pregnancy related conditions, first trimester: Secondary | ICD-10-CM | POA: Insufficient documentation

## 2017-03-07 DIAGNOSIS — Z87891 Personal history of nicotine dependence: Secondary | ICD-10-CM | POA: Insufficient documentation

## 2017-03-07 DIAGNOSIS — Z833 Family history of diabetes mellitus: Secondary | ICD-10-CM | POA: Insufficient documentation

## 2017-03-07 LAB — URINALYSIS, ROUTINE W REFLEX MICROSCOPIC
BILIRUBIN URINE: NEGATIVE
Glucose, UA: NEGATIVE mg/dL
Hgb urine dipstick: NEGATIVE
Ketones, ur: NEGATIVE mg/dL
Leukocytes, UA: NEGATIVE
NITRITE: NEGATIVE
Protein, ur: NEGATIVE mg/dL
SPECIFIC GRAVITY, URINE: 1.008 (ref 1.005–1.030)
pH: 7 (ref 5.0–8.0)

## 2017-03-07 LAB — COMPREHENSIVE METABOLIC PANEL
ALK PHOS: 44 U/L (ref 38–126)
ALT: 14 U/L (ref 14–54)
AST: 19 U/L (ref 15–41)
Albumin: 3.9 g/dL (ref 3.5–5.0)
Anion gap: 6 (ref 5–15)
BILIRUBIN TOTAL: 0.6 mg/dL (ref 0.3–1.2)
BUN: 7 mg/dL (ref 6–20)
CALCIUM: 9 mg/dL (ref 8.9–10.3)
CO2: 25 mmol/L (ref 22–32)
CREATININE: 0.64 mg/dL (ref 0.44–1.00)
Chloride: 104 mmol/L (ref 101–111)
Glucose, Bld: 87 mg/dL (ref 65–99)
Potassium: 3.7 mmol/L (ref 3.5–5.1)
Sodium: 135 mmol/L (ref 135–145)
Total Protein: 6.5 g/dL (ref 6.5–8.1)

## 2017-03-07 LAB — CBC
HEMATOCRIT: 37.6 % (ref 36.0–46.0)
HEMOGLOBIN: 12.6 g/dL (ref 12.0–15.0)
MCH: 25 pg — AB (ref 26.0–34.0)
MCHC: 33.5 g/dL (ref 30.0–36.0)
MCV: 74.6 fL — AB (ref 78.0–100.0)
PLATELETS: 270 10*3/uL (ref 150–400)
RBC: 5.04 MIL/uL (ref 3.87–5.11)
RDW: 15.4 % (ref 11.5–15.5)
WBC: 6.4 10*3/uL (ref 4.0–10.5)

## 2017-03-07 MED ORDER — METOCLOPRAMIDE HCL 5 MG/ML IJ SOLN
5.0000 mg | Freq: Once | INTRAMUSCULAR | Status: AC
  Administered 2017-03-07: 5 mg via INTRAVENOUS
  Filled 2017-03-07: qty 2

## 2017-03-07 MED ORDER — LACTATED RINGERS IV BOLUS (SEPSIS)
500.0000 mL | Freq: Once | INTRAVENOUS | Status: AC
  Administered 2017-03-07: 500 mL via INTRAVENOUS

## 2017-03-07 NOTE — Progress Notes (Signed)
Patient states she is feeling better, headache has improved, still feeling light headed.

## 2017-03-07 NOTE — MAU Provider Note (Signed)
Patient Wendy Maddox is a 26 y.o. G4P1021  At [redacted]w[redacted]d Here with dizziness, weakness and unable to keep anything down. She has been taking phenergan off and on.   She thinks her headache could be from squinting her eyes as she has recently broken her glasses and can't see that well.  History     CSN: [id number]  Arrival date and time: 03/07/17 0753   None     Chief Complaint  Patient presents with  . Abdominal Cramping  . Emesis During Pregnancy  . Headache   Abdominal Cramping  Associated symptoms include headaches and nausea.  Headache   This is a new problem. The current episode started in the past 7 days. The problem occurs constantly. The problem has been gradually worsening. The pain is located in the frontal and occipital region. The quality of the pain is described as throbbing. The pain is at a severity of 8/10. Associated symptoms include dizziness and nausea. Pertinent negatives include no phonophobia or visual change. Nothing aggravates the symptoms. She has tried nothing for the symptoms.  Dizziness  Associated symptoms include headaches and nausea. Pertinent negatives include no visual change.   Emesis:  Yesterday she had 5 episodes of vomiting and threw up stomach contents during the day. She kept down her slice of pizza last night.  This morning she has had 3 episodes of vomiting this morning.  OB History    Gravida Para Term Preterm AB Living   4 1 1   2 1    SAB TAB Ectopic Multiple Live Births   1   1   1       Past Medical History:  Diagnosis Date  . Chlamydia   . Ovarian cyst   . Pregnancy induced hypertension     Past Surgical History:  Procedure Laterality Date  . NO PAST SURGERIES      Family History  Problem Relation Age of Onset  . Diabetes Mother   . Asthma Father   . Asthma Daughter   . Diabetes Maternal Grandmother   . Diabetes Paternal Grandmother     Social History  Substance Use Topics  . Smoking status: Former Smoker   Packs/day: 0.25    Types: Cigarettes  . Smokeless tobacco: Never Used     Comment: Oct 2017  . Alcohol use Yes     Comment: every other week    Allergies: No Known Allergies  Prescriptions Prior to Admission  Medication Sig Dispense Refill Last Dose  . acetaminophen (TYLENOL) 500 MG tablet Take 1,000 mg by mouth every 6 (six) hours as needed for mild pain, moderate pain or headache.   08/25/2016 at Unknown time  . metroNIDAZOLE (FLAGYL) 500 MG tablet Take 1 tablet (500 mg total) by mouth 2 (two) times daily. 14 tablet 0   . promethazine (PHENERGAN) 25 MG tablet Take 1 tablet (25 mg total) by mouth every 6 (six) hours as needed for nausea or vomiting. 30 tablet 0     Review of Systems  Respiratory: Negative.   Cardiovascular: Negative.   Gastrointestinal: Positive for nausea.  Genitourinary: Negative.   Musculoskeletal: Negative.   Neurological: Positive for dizziness and headaches.   Physical Exam   Blood pressure 118/68, pulse 80, temperature 99.2 F (37.3 C), height 5\' 1"  (1.549 m), weight 112 lb 1.9 oz (50.9 kg), last menstrual period 01/19/2017, unknown if currently breastfeeding.  Physical Exam  Constitutional: She is oriented to person, place, and time. She appears well-developed.  HENT:  Head:  Normocephalic.  Neck: Normal range of motion.  Respiratory: Effort normal.  GI: Soft. Bowel sounds are normal.  Musculoskeletal: Normal range of motion.  Neurological: She is alert and oriented to person, place, and time. She has normal reflexes.  Skin: Skin is warm and dry.  Psychiatric: She has a normal mood and affect.    MAU Course  Procedures  MDM -IV  Fluids -IV reglan -Patient feeling better; has kept down liquids and crackers  Assessment and Plan   1. Morning sickness    2. Discharge with recommendation to conitnue taking phenergan at night and start B6 and doxyalmnie in the morning.  3. Return to MAU if she can't keep liquids down.    Charlesetta GaribaldiKathryn Lorraine  Diyari Cherne CNM 03/07/2017, 9:39 AM

## 2017-03-07 NOTE — Progress Notes (Signed)
Patient given crackers and ginger ale for PO challenge.

## 2017-03-07 NOTE — Discharge Instructions (Signed)

## 2017-03-19 ENCOUNTER — Encounter (HOSPITAL_COMMUNITY): Payer: Self-pay

## 2017-03-19 ENCOUNTER — Inpatient Hospital Stay (HOSPITAL_COMMUNITY)
Admission: AD | Admit: 2017-03-19 | Discharge: 2017-03-19 | Disposition: A | Discharge status: Home / Self Care | Payer: Medicaid Other | Source: Ambulatory Visit | Attending: Obstetrics and Gynecology | Admitting: Obstetrics and Gynecology

## 2017-03-19 DIAGNOSIS — O26891 Other specified pregnancy related conditions, first trimester: Secondary | ICD-10-CM | POA: Diagnosis not present

## 2017-03-19 DIAGNOSIS — Z87891 Personal history of nicotine dependence: Secondary | ICD-10-CM | POA: Diagnosis not present

## 2017-03-19 DIAGNOSIS — Z3491 Encounter for supervision of normal pregnancy, unspecified, first trimester: Secondary | ICD-10-CM

## 2017-03-19 DIAGNOSIS — O9989 Other specified diseases and conditions complicating pregnancy, childbirth and the puerperium: Secondary | ICD-10-CM

## 2017-03-19 DIAGNOSIS — K0889 Other specified disorders of teeth and supporting structures: Secondary | ICD-10-CM | POA: Diagnosis not present

## 2017-03-19 DIAGNOSIS — Z3A08 8 weeks gestation of pregnancy: Secondary | ICD-10-CM | POA: Insufficient documentation

## 2017-03-19 DIAGNOSIS — R590 Localized enlarged lymph nodes: Secondary | ICD-10-CM | POA: Diagnosis not present

## 2017-03-19 DIAGNOSIS — R221 Localized swelling, mass and lump, neck: Secondary | ICD-10-CM | POA: Diagnosis present

## 2017-03-19 LAB — CBC WITH DIFFERENTIAL/PLATELET
Basophils Absolute: 0 10*3/uL (ref 0.0–0.1)
Basophils Relative: 0 %
EOS ABS: 0 10*3/uL (ref 0.0–0.7)
EOS PCT: 0 %
HCT: 35.9 % — ABNORMAL LOW (ref 36.0–46.0)
Hemoglobin: 12 g/dL (ref 12.0–15.0)
LYMPHS PCT: 43 %
Lymphs Abs: 3.1 10*3/uL (ref 0.7–4.0)
MCH: 24.8 pg — AB (ref 26.0–34.0)
MCHC: 33.4 g/dL (ref 30.0–36.0)
MCV: 74.3 fL — AB (ref 78.0–100.0)
MONO ABS: 0.5 10*3/uL (ref 0.1–1.0)
Monocytes Relative: 6 %
Neutro Abs: 3.7 10*3/uL (ref 1.7–7.7)
Neutrophils Relative %: 51 %
PLATELETS: 285 10*3/uL (ref 150–400)
RBC: 4.83 MIL/uL (ref 3.87–5.11)
RDW: 15.1 % (ref 11.5–15.5)
WBC: 7.3 10*3/uL (ref 4.0–10.5)

## 2017-03-19 NOTE — MAU Provider Note (Signed)
History     CSN: [id number]  Arrival date and time: 03/19/17 1146   None     Chief Complaint  Patient presents with  . 2 bumps on neck   HPI Wendy Maddox is 26 y.o. [id number] [redacted]w[redacted]d weeks presenting with "knots on the back of my neck".  States they have been there for 5 years ago.  Her doctor told her it was from a scalp infection.  States they have  "gotten bigger" over the last 4 days.  States she thinks it is from her right upper back tooth that is sore.  She has app[facility] at [facility] to begin prenatal care July 5.     Past Medical History:  Diagnosis Date  . Chlamydia   . Ovarian cyst   . Pregnancy induced hypertension     Past Surgical History:  Procedure Laterality Date  . NO PAST SURGERIES      Family History  Problem Relation Age of Onset  . Diabetes Mother   . Asthma Father   . Asthma Daughter   . Diabetes Maternal Grandmother   . Diabetes Paternal Grandmother     Social History  Substance Use Topics  . Smoking status: Former Smoker    Packs/day: 0.25    Types: Cigarettes  . Smokeless tobacco: Never Used     Comment: Oct 2017  . Alcohol use Yes     Comment: every other week    Allergies: No Known Allergies  Prescriptions Prior to Admission  Medication Sig Dispense Refill Last Dose  . acetaminophen (TYLENOL) 500 MG tablet Take 1,000 mg by mouth every 6 (six) hours as needed for mild pain, moderate pain or headache.   08/25/2016 at Unknown time  . metroNIDAZOLE (FLAGYL) 500 MG tablet Take 1 tablet (500 mg total) by mouth 2 (two) times daily. 14 tablet 0   . promethazine (PHENERGAN) 25 MG tablet Take 1 tablet (25 mg total) by mouth every 6 (six) hours as needed for nausea or vomiting. 30 tablet 0     Review of Systems  Constitutional: Positive for chills. Negative for fatigue. Unexpected weight change: not related to fever.  HENT: Positive for dental problem (upper right back toothpain). Negative for ear pain and facial swelling.   Gastrointestinal: Negative  for abdominal pain.  Genitourinary: Negative for dysuria, hematuria, pelvic pain, vaginal bleeding and vaginal pain.  Neurological: Positive for headaches.   Physical Exam   Blood pressure 122/72, pulse 66, temperature 97.8 F (36.6 C), temperature source Oral, resp. rate 16, weight 114 lb 8 oz (51.9 kg), last menstrual period 01/19/2017, SpO2 100 %, unknown if currently breastfeeding.  Physical Exam  Constitutional: She is oriented to person, place, and time. She appears well-developed and well-nourished. No distress.  HENT:  Head: Normocephalic.  Negative for swelling, redness or discharge at the tooth of concern.  Neck:  2 mobile, slightly tender enlarged lymph nodes noted right superficial cervical area.  Neg for redness.    Genitourinary:  Genitourinary Comments: Deferred, neg for vaginal complaints.  Neurological: She is alert and oriented to person, place, and time.  Skin: Skin is warm and dry.  Psychiatric: She has a normal mood and affect. Her behavior is normal. Thought content normal.   Results for orders placed or performed during the hospital encounter of 03/19/17 (from the past 24 hour(s))  CBC with Differential/Platelet     Status: Abnormal   Collection Time: 03/19/17 12:21 PM  Result Value Ref Range   WBC 7.3 4.0 -  10.5 K/uL   RBC 4.83 3.87 - 5.11 MIL/uL   Hemoglobin 12.0 12.0 - 15.0 g/dL   HCT 16.135.9 (L) 09.636.0 - 04.546.0 %   MCV 74.3 (L) 78.0 - 100.0 fL   MCH 24.8 (L) 26.0 - 34.0 pg   MCHC 33.4 30.0 - 36.0 g/dL   RDW 40.915.1 81.111.5 - 91.415.5 %   Platelets 285 150 - 400 K/uL   Neutrophils Relative % 51 %   Neutro Abs 3.7 1.7 - 7.7 K/uL   Lymphocytes Relative 43 %   Lymphs Abs 3.1 0.7 - 4.0 K/uL   Monocytes Relative 6 %   Monocytes Absolute 0.5 0.1 - 1.0 K/uL   Eosinophils Relative 0 %   Eosinophils Absolute 0.0 0.0 - 0.7 K/uL   Basophils Relative 0 %   Basophils Absolute 0.0 0.0 - 0.1 K/uL    MAU Course  Procedures  MDM MSE Exam Labs  Assessment and Plan  A;   Lymphadenopathy-superficial cervical      Upper right molar tooth pain  P:  Names of dentists given to patient to call for dental care that may related to enlarged nodes      Encouraged her to keep schedule prenatal appt with [facility].  Dennison Mascotve M Azari Janssens 03/19/2017, 12:07 PM

## 2017-03-19 NOTE — Discharge Instructions (Signed)
First Trimester of Pregnancy The first trimester of pregnancy is from week 1 until the end of week 13 (months 1 through 3). During this time, your baby will begin to develop inside you. At 6-8 weeks, the eyes and face are formed, and the heartbeat can be seen on ultrasound. At the end of 12 weeks, all the baby's organs are formed. Prenatal care is all the medical care you receive before the birth of your baby. Make sure you get good prenatal care and follow all of your doctor's instructions. Follow these instructions at home: Medicines  Take over-the-counter and prescription medicines only as told by your doctor. Some medicines are safe and some medicines are not safe during pregnancy.  Take a prenatal vitamin that contains at least 600 micrograms (mcg) of folic acid.  If you have trouble pooping (constipation), take medicine that will make your stool soft (stool softener) if your doctor approves. Eating and drinking  Eat regular, healthy meals.  Your doctor will tell you the amount of weight gain that is right for you.  Avoid raw meat and uncooked cheese.  If you feel sick to your stomach (nauseous) or throw up (vomit): ? Eat 4 or 5 small meals a day instead of 3 large meals. ? Try eating a few soda crackers. ? Drink liquids between meals instead of during meals.  To prevent constipation: ? Eat foods that are high in fiber, like fresh fruits and vegetables, whole grains, and beans. ? Drink enough fluids to keep your pee (urine) clear or pale yellow. Activity  Exercise only as told by your doctor. Stop exercising if you have cramps or pain in your lower belly (abdomen) or low back.  Do not exercise if it is too hot, too humid, or if you are in a place of great height (high altitude).  Try to avoid standing for long periods of time. Move your legs often if you must stand in one place for a long time.  Avoid heavy lifting.  Wear low-heeled shoes. Sit and stand up straight.  You  can have sex unless your doctor tells you not to. Relieving pain and discomfort  Wear a good support bra if your breasts are sore.  Take warm water baths (sitz baths) to soothe pain or discomfort caused by hemorrhoids. Use hemorrhoid cream if your doctor says it is okay.  Rest with your legs raised if you have leg cramps or low back pain.  If you have puffy, bulging veins (varicose veins) in your legs: ? Wear support hose or compression stockings as told by your doctor. ? Raise (elevate) your feet for 15 minutes, 3-4 times a day. ? Limit salt in your food. Prenatal care  Schedule your prenatal visits by the twelfth week of pregnancy.  Write down your questions. Take them to your prenatal visits.  Keep all your prenatal visits as told by your doctor. This is important. Safety  Wear your seat belt at all times when driving.  Make a list of emergency phone numbers. The list should include numbers for family, friends, the hospital, and police and fire departments. General instructions  Ask your doctor for a referral to a local prenatal class. Begin classes no later than at the start of month 6 of your pregnancy.  Ask for help if you need counseling or if you need help with nutrition. Your doctor can give you advice or tell you where to go for help.  Do not use hot tubs, steam rooms, or  saunas.  Do not douche or use tampons or scented sanitary pads.  Do not cross your legs for long periods of time.  Avoid all herbs and alcohol. Avoid drugs that are not approved by your doctor.  Do not use any tobacco products, including cigarettes, chewing tobacco, and electronic cigarettes. If you need help quitting, ask your doctor. You may get counseling or other support to help you quit.  Avoid cat litter boxes and soil used by cats. These carry germs that can cause birth defects in the baby and can cause a loss of your baby (miscarriage) or stillbirth.  Visit your dentist. At home, brush  your teeth with a soft toothbrush. Be gentle when you floss. Contact a doctor if:  You are dizzy.  You have mild cramps or pressure in your lower belly.  You have a nagging pain in your belly area.  You continue to feel sick to your stomach, you throw up, or you have watery poop (diarrhea).  You have a bad smelling fluid coming from your vagina.  You have pain when you pee (urinate).  You have increased puffiness (swelling) in your face, hands, legs, or ankles. Get help right away if:  You have a fever.  You are leaking fluid from your vagina.  You have spotting or bleeding from your vagina.  You have very bad belly cramping or pain.  You gain or lose weight rapidly.  You throw up blood. It may look like coffee grounds.  You are around people who have [place] measles, fifth disease, or chickenpox.  You have a very bad headache.  You have shortness of breath.  You have any kind of trauma, such as from a fall or a car accident. Summary  The first trimester of pregnancy is from week 1 until the end of week 13 (months 1 through 3).  To take care of yourself and your unborn baby, you will need to eat healthy meals, take medicines only if your doctor tells you to do so, and do activities that are safe for you and your baby.  Keep all follow-up visits as told by your doctor. This is important as your doctor will have to ensure that your baby is healthy and growing well. This information is not intended to replace advice given to you by your health care provider. Make sure you discuss any questions you have with your health care provider. Document Released: 03/09/2008 Document Revised: 09/29/2016 Document Reviewed: 09/29/2016 Elsevier Interactive Patient Education  2017 Elsevier Inc. Dental Pain Dental pain may be caused by many things, including:  Tooth decay (cavities or caries). Cavities cause the nerve of your tooth to be open to air and hot or cold temperatures. This  can cause pain or discomfort.  Abscess or infection. A dental abscess is an area that is full of infected pus from a bacterial infection in the inner part of the tooth (pulp). It usually happens at the end of the tooths root.  Injury.  An unknown reason (idiopathic).  Your pain may be mild or severe. It may only happen when:  You are chewing.  You are exposed to hot or cold temperature.  You are eating or drinking sugary foods or beverages, such as: ? Soda. ? Candy.  Your pain may also be there all of the time. Follow these instructions at home: Watch your dental pain for any changes. Do these things to lessen your discomfort:  Take medicines only as told by your dentist.  If  your dentist tells you to take an antibiotic medicine, finish all of it even if you start to feel better.  Keep all follow-up visits as told by your dentist. This is important.  Do not apply heat to the outside of your face.  Rinse your mouth or gargle with salt water if told by your dentist. This helps with pain and swelling. ? You can make salt water by adding  tsp of salt to 1 cup of warm water.  Apply ice to the painful area of your face: ? Put ice in a plastic bag. ? Place a towel between your skin and the bag. ? Leave the ice on for 20 minutes, 2-3 times per day.  Avoid foods or drinks that cause you pain, such as: ? Very hot or very cold foods or drinks. ? Sweet or sugary foods or drinks.  Contact a doctor if:  Your pain is not helped with medicines.  Your symptoms are worse.  You have new symptoms. Get help right away if:  You cannot open your mouth.  You are having trouble breathing or swallowing.  You have a fever.  Your face, neck, or jaw is puffy (swollen). This information is not intended to replace advice given to you by your health care provider. Make sure you discuss any questions you have with your health care provider. Document Released: 03/09/2008 Document Revised:  02/27/2016 Document Reviewed: 09/17/2014 Elsevier Interactive Patient Education  2018 Elsevier Inc.   LIST OF DENTIST WRITTEN DOWN AND GIVEN TO PATIENT

## 2017-03-19 NOTE — MAU Note (Signed)
Has 2 "knots" on the back of her neck, rt side, under the skin.  Has had them for a while.  A dr told her it might be from a scalp infection (over a yr ago), never went away; she thought it might be from an oral infection. Have gotten more swollen 3 days ago, that is when it started hurting.

## 2017-03-19 NOTE — Progress Notes (Signed)
1307: Discharge instructions given with pt understanding. Pt left unit via ambulatory.

## 2017-04-12 ENCOUNTER — Other Ambulatory Visit (HOSPITAL_COMMUNITY): Payer: Self-pay | Admitting: Nurse Practitioner

## 2017-04-12 DIAGNOSIS — Z3682 Encounter for antenatal screening for nuchal translucency: Secondary | ICD-10-CM

## 2017-04-12 LAB — OB RESULTS CONSOLE RUBELLA ANTIBODY, IGM: Rubella: IMMUNE

## 2017-04-12 LAB — OB RESULTS CONSOLE RPR: RPR: NONREACTIVE

## 2017-04-12 LAB — OB RESULTS CONSOLE VARICELLA ZOSTER ANTIBODY, IGG: Varicella: IMMUNE

## 2017-04-12 LAB — OB RESULTS CONSOLE HEPATITIS B SURFACE ANTIGEN: HEP B S AG: NEGATIVE

## 2017-04-26 ENCOUNTER — Ambulatory Visit (HOSPITAL_COMMUNITY)
Admission: RE | Admit: 2017-04-26 | Discharge: 2017-04-26 | Disposition: A | Discharge status: Home / Self Care | Payer: Medicaid Other | Source: Ambulatory Visit | Attending: Nurse Practitioner | Admitting: Nurse Practitioner

## 2017-04-26 ENCOUNTER — Encounter (HOSPITAL_COMMUNITY): Payer: Self-pay

## 2017-04-26 DIAGNOSIS — O10011 Pre-existing essential hypertension complicating pregnancy, first trimester: Secondary | ICD-10-CM | POA: Diagnosis not present

## 2017-04-26 DIAGNOSIS — Z3682 Encounter for antenatal screening for nuchal translucency: Secondary | ICD-10-CM | POA: Insufficient documentation

## 2017-04-26 DIAGNOSIS — Z3A13 13 weeks gestation of pregnancy: Secondary | ICD-10-CM | POA: Diagnosis not present

## 2017-05-21 ENCOUNTER — Other Ambulatory Visit: Payer: Self-pay

## 2017-05-28 ENCOUNTER — Telehealth: Payer: Self-pay | Admitting: Internal Medicine

## 2017-05-28 NOTE — Telephone Encounter (Signed)
Received records from [facility] for appointment on 06/10/17 with Dr Rennis Golden.  Records put with Dr Blanchie Dessert schedule for 06/10/17. lp

## 2017-06-10 ENCOUNTER — Ambulatory Visit (INDEPENDENT_AMBULATORY_CARE_PROVIDER_SITE_OTHER): Payer: Medicaid Other | Admitting: Internal Medicine

## 2017-06-10 ENCOUNTER — Encounter: Payer: Self-pay | Admitting: Internal Medicine

## 2017-06-10 ENCOUNTER — Encounter (INDEPENDENT_AMBULATORY_CARE_PROVIDER_SITE_OTHER): Payer: Self-pay

## 2017-06-10 ENCOUNTER — Telehealth: Payer: Self-pay | Admitting: Internal Medicine

## 2017-06-10 VITALS — BP 103/64 | HR 77 | Ht 61.0 in | Wt 121.4 lb

## 2017-06-10 DIAGNOSIS — Z331 Pregnant state, incidental: Secondary | ICD-10-CM | POA: Diagnosis not present

## 2017-06-10 DIAGNOSIS — R0602 Shortness of breath: Secondary | ICD-10-CM | POA: Diagnosis not present

## 2017-06-10 DIAGNOSIS — R002 Palpitations: Secondary | ICD-10-CM | POA: Diagnosis not present

## 2017-06-10 DIAGNOSIS — R55 Syncope and collapse: Secondary | ICD-10-CM

## 2017-06-10 DIAGNOSIS — R42 Dizziness and giddiness: Secondary | ICD-10-CM

## 2017-06-10 DIAGNOSIS — Z3A20 20 weeks gestation of pregnancy: Secondary | ICD-10-CM

## 2017-06-10 NOTE — Telephone Encounter (Signed)
New message     1. What dental office are you calling from? [facility]   2. What is your office phone and fax number?  [facility] [phone] fax [phone]  3. What type of procedure is the patient having performed?  Routine cleaning  4. What date is procedure scheduled?  Today- pt in chair   5. What is your question (ex. Antibiotics prior to procedure, holding medication-we need to know how long dentist wants pt to hold med)?  Needs advisement asap

## 2017-06-10 NOTE — Patient Instructions (Signed)
Medication Instructions: Dr Rennis GoldenHilty recommends that you continue on your current medications as directed. Please refer to the Current Medication list given to you today.  Labwork: NONE ORDERED  Testing/Procedures: 1. 48-hour Holter Monitor - Your physician has recommended that you wear a holter monitor. Holter monitors are medical devices that record the heart's electrical activity. Doctors most often use these monitors to diagnose arrhythmias. Arrhythmias are problems with the speed or rhythm of the heartbeat. The monitor is a small, portable device. You can wear one while you do your normal daily activities. This is usually used to diagnose what is causing palpitations/syncope (passing out).  2. Echocardiogram - Your physician has requested that you have an echocardiogram. Echocardiography is a painless test that uses sound waves to create images of your heart. It provides your doctor with information about the size and shape of your heart and how well your heart's chambers and valves are working. This procedure takes approximately one hour. There are no restrictions for this procedure.  **These tests will be performed/[facility] - [address], [address].  Follow-up: Dr Rennis GoldenHilty recommends that you schedule a follow-up appointment after testing is completed.  If you need a refill on your cardiac medications before your next appointment, please call your pharmacy.

## 2017-06-10 NOTE — Telephone Encounter (Signed)
Faxed via Epic

## 2017-06-10 NOTE — Telephone Encounter (Signed)
No dental prophylaxis. No contraindication to dental work.  Dr. HRexene Edison

## 2017-06-10 NOTE — Telephone Encounter (Signed)
Left detailed message for Wendy Maddox

## 2017-06-10 NOTE — Progress Notes (Signed)
OFFICE CONSULT NOTE  Chief Complaint:  Palpitations, dyspnea  Primary Care Physician: Wendy Ao, NP  HPI:  Wendy Maddox is a 26 y.o. female who is being seen today for the evaluation of palpitations, dyspnea at the request of Wendy Melnick, NP. Wendy Maddox is a pleasant young woman who is currently about [redacted] weeks pregnant with her second child. She had a girl about 5 years ago and now is having a boy. She works at [employer]. She has history of preeclampsia during her first pregnancy and was on medication. She is now properly on aspirin during this pregnancy but on no blood pressure medicine as of yet. For the past several weeks she has had progressively increasing shortness of breath. She also reports daily palpitations and episodes of presyncope. She says that she was cooking one night by the stove and felt that her vision great out and almost passed out. She appropriately sat down. She says she feels like her heart races. She also finds it difficult to get a breath. Particularly walking up stairs. She denies any chest pain. Blood pressure today was normal 103/64. Family history is significant for arrhythmia, both in her mother and 2 of her siblings.  PMHx:  Past Medical History:  Diagnosis Date  . Chlamydia   . Dizziness   . Near syncope   . Ovarian cyst   . Palpitations   . Pregnancy induced hypertension     Past Surgical History:  Procedure Laterality Date  . NO PAST SURGERIES      FAMHx:  Family History  Problem Relation Age of Onset  . Diabetes Mother   . Bipolar disorder Mother   . Asthma Father   . Asthma Daughter   . Diabetes Maternal Grandmother   . Diabetes Paternal Grandmother     SOCHx:   reports that she has quit smoking. Her smoking use included Cigarettes. She smoked 0.25 packs per day. She has never used smokeless tobacco. She reports that she drinks alcohol. She reports that she uses drugs, including Marijuana.  ALLERGIES:  No Known  Allergies  ROS: Pertinent items noted in HPI and remainder of comprehensive ROS otherwise negative.  HOME MEDS: Current Outpatient Prescriptions on File Prior to Visit  Medication Sig Dispense Refill  . Prenatal Vit-Fe Fumarate-FA (PRENATAL VITAMIN PO) Take by mouth.    . promethazine (PHENERGAN) 25 MG tablet Take 1 tablet (25 mg total) by mouth every 6 (six) hours as needed for nausea or vomiting. 30 tablet 0   No current facility-administered medications on file prior to visit.     LABS/IMAGING: No results found for this or any previous visit (from the past 48 hour(s)). No results found.  LIPID PANEL: No results found for: CHOL, TRIG, HDL, CHOLHDL, VLDL, LDLCALC, LDLDIRECT  WEIGHTS: Wt Readings from Last 3 Encounters:  06/10/17 121 lb 6.4 oz (55.1 kg)  04/26/17 119 lb 3.2 oz (54.1 kg)  03/19/17 114 lb 8 oz (51.9 kg)    VITALS: BP 103/64 (BP Location: Right Arm, Cuff Size: Normal)   Pulse 77   Ht  (1.549 m)   Wt 121 lb 6.4 oz (55.1 kg)   LMP 01/19/2017   BMI 22.94 kg/m   EXAM: General appearance: alert, no distress and Thin Neck: no carotid bruit, no JVD and thyroid not enlarged, symmetric, no tenderness/mass/nodules Lungs: clear to auscultation bilaterally Heart: regular rate and rhythm, S1, S2 normal and systolic murmur: early systolic 2/6, blowing at 2nd right intercostal space Abdomen: Gravid uterus  Extremities: extremities normal, atraumatic, no cyanosis or edema Pulses: 2+ and symmetric Skin: Skin color, texture, turgor normal. No rashes or lesions Neurologic: Grossly normal Psych: Pleasant  EKG: Normal sinus rhythm at 69- personally reviewed  ASSESSMENT: 1. Palpitations/tachycardia 2. Presyncope 3. History of preeclampsia 4. [redacted] weeks pregnant  PLAN: 1.   Wendy Maddox is [redacted] weeks pregnant with her second child and is describing palpitations and tachycardia with presyncope. Blood pressure is normal today however she has a history of preeclampsia.  She is appropriately on low-dose aspirin but not currently on any blood pressure medication. There is a family history of arrhythmia in her siblings as well as her mother. She did not describe these episodes during her first pregnancy however is more bothered by them now. I like to get an echocardiogram to rule out any cardiomyopathy or structural abnormalities of heart. She does have us early systolic murmur which is likely flow related, however she has been presyncopal and I would certainly like to rule out any other structural abnormalities. We'll also obtain a 48-hour monitor as her events are occurring almost on a daily basis.  Follow-up with me afterwards. Thanks again for the kind referral.  Chrystie NoseKenneth C. Kile Kabler, MD, [facility]ACC  [place]  [facility]  Attending Cardiologist  Direct Dial: [phone]  Fax: [phone]  Website:  [email or web address]  Idan Prime C Thorvald Orsino 06/10/2017, 8:43 AM

## 2017-06-24 ENCOUNTER — Ambulatory Visit (INDEPENDENT_AMBULATORY_CARE_PROVIDER_SITE_OTHER): Payer: Medicaid Other

## 2017-06-24 ENCOUNTER — Ambulatory Visit (HOSPITAL_COMMUNITY): Payer: Medicaid Other | Attending: Cardiology

## 2017-06-24 ENCOUNTER — Other Ambulatory Visit: Payer: Self-pay

## 2017-06-24 DIAGNOSIS — O26899 Other specified pregnancy related conditions, unspecified trimester: Secondary | ICD-10-CM | POA: Insufficient documentation

## 2017-06-24 DIAGNOSIS — R55 Syncope and collapse: Secondary | ICD-10-CM | POA: Insufficient documentation

## 2017-06-24 DIAGNOSIS — Z3A00 Weeks of gestation of pregnancy not specified: Secondary | ICD-10-CM | POA: Diagnosis not present

## 2017-06-24 DIAGNOSIS — R002 Palpitations: Secondary | ICD-10-CM | POA: Diagnosis not present

## 2017-06-24 DIAGNOSIS — R0602 Shortness of breath: Secondary | ICD-10-CM

## 2017-06-24 DIAGNOSIS — R42 Dizziness and giddiness: Secondary | ICD-10-CM | POA: Diagnosis not present

## 2017-07-15 ENCOUNTER — Ambulatory Visit: Payer: Medicaid Other | Admitting: Internal Medicine

## 2017-07-30 ENCOUNTER — Inpatient Hospital Stay (HOSPITAL_COMMUNITY)
Admission: AD | Admit: 2017-07-30 | Discharge: 2017-07-30 | Disposition: A | Discharge status: Home / Self Care | Payer: Medicaid Other | Source: Ambulatory Visit | Attending: Obstetrics and Gynecology | Admitting: Obstetrics and Gynecology

## 2017-07-30 ENCOUNTER — Encounter (HOSPITAL_COMMUNITY): Payer: Self-pay

## 2017-07-30 DIAGNOSIS — Z87891 Personal history of nicotine dependence: Secondary | ICD-10-CM | POA: Diagnosis not present

## 2017-07-30 DIAGNOSIS — R102 Pelvic and perineal pain: Secondary | ICD-10-CM | POA: Diagnosis not present

## 2017-07-30 DIAGNOSIS — R0602 Shortness of breath: Secondary | ICD-10-CM | POA: Insufficient documentation

## 2017-07-30 DIAGNOSIS — O26893 Other specified pregnancy related conditions, third trimester: Secondary | ICD-10-CM

## 2017-07-30 DIAGNOSIS — Z3A27 27 weeks gestation of pregnancy: Secondary | ICD-10-CM | POA: Insufficient documentation

## 2017-07-30 DIAGNOSIS — R55 Syncope and collapse: Secondary | ICD-10-CM | POA: Insufficient documentation

## 2017-07-30 DIAGNOSIS — R002 Palpitations: Secondary | ICD-10-CM

## 2017-07-30 DIAGNOSIS — R42 Dizziness and giddiness: Secondary | ICD-10-CM | POA: Insufficient documentation

## 2017-07-30 LAB — URINALYSIS, ROUTINE W REFLEX MICROSCOPIC
Bilirubin Urine: NEGATIVE
GLUCOSE, UA: NEGATIVE mg/dL
HGB URINE DIPSTICK: NEGATIVE
KETONES UR: 5 mg/dL — AB
Leukocytes, UA: NEGATIVE
Nitrite: NEGATIVE
Protein, ur: 30 mg/dL — AB
Specific Gravity, Urine: 1.024 (ref 1.005–1.030)
pH: 7 (ref 5.0–8.0)

## 2017-07-30 LAB — WET PREP, GENITAL
CLUE CELLS WET PREP: NONE SEEN
Sperm: NONE SEEN
Trich, Wet Prep: NONE SEEN
Yeast Wet Prep HPF POC: NONE SEEN

## 2017-07-30 NOTE — MAU Note (Signed)
Keeps having sharp pains in vagina. Has been short of breath, doesn't matter if she is laying down, standing or sitting- she just can't catch her breath.  Having pains in her lower abd, like period pain.

## 2017-07-30 NOTE — MAU Provider Note (Signed)
Chief Complaint:  Shortness of Breath; Abdominal Pain; and Vaginal Pain   First Provider Initiated Contact with Patient 07/30/17 1706      HPI: Wendy Maddox is a 26 y.o. G4P1021 at [redacted]w[redacted]d who presents to maternity admissions reporting pinching intermittent vaginal pain x 2 days.  She also reports shortness of breath, feels like the baby is in the way and she can't get a deep breath. She denies cough or recent URI.  She is followed by cardiology during this pregnancy for syncopal episodes and tachycardia. She has no know cardiac hx prior to pregnancy.  She has no tried any treatments for her symptoms. There are no other associated symptoms. She reports good fetal movement, denies LOF, vaginal bleeding, vaginal itching/burning, urinary symptoms, h/a, dizziness, n/v, or fever/chills.    HPI  Past Medical History: Past Medical History:  Diagnosis Date  . Chlamydia   . Dizziness   . Near syncope   . Ovarian cyst   . Palpitations   . Pregnancy induced hypertension     Past obstetric history: OB History  Gravida Para Term Preterm AB Living  4 1 1   2 1   SAB TAB Ectopic Multiple Live Births  1   1   1     # Outcome Date GA Lbr Len/2nd Weight Sex Delivery Anes PTL Lv  4 Current           3 Ectopic           2 SAB           1 Term     F Vag-Spont   LIV      Past Surgical History: Past Surgical History:  Procedure Laterality Date  . NO PAST SURGERIES      Family History: Family History  Problem Relation Age of Onset  . Diabetes Mother   . Bipolar disorder Mother   . Asthma Father   . Asthma Daughter   . Diabetes Maternal Grandmother   . Diabetes Paternal Grandmother     Social History: Social History  Substance Use Topics  . Smoking status: Former Smoker    Packs/day: 0.25    Types: Cigarettes  . Smokeless tobacco: Never Used     Comment: Oct 2017  . Alcohol use Yes     Comment: every other week    Allergies: No Known Allergies  Meds:  No prescriptions  prior to admission.    ROS:  Review of Systems  Constitutional: Negative for chills, fatigue and fever.  Eyes: Negative for visual disturbance.  Respiratory: Positive for shortness of breath.   Cardiovascular: Negative for chest pain.  Gastrointestinal: Negative for abdominal pain, nausea and vomiting.  Genitourinary: Positive for pelvic pain. Negative for difficulty urinating, dysuria, flank pain, vaginal bleeding, vaginal discharge and vaginal pain.  Neurological: Negative for dizziness and headaches.  Psychiatric/Behavioral: Negative.      I have reviewed patient's Past Medical Hx, Surgical Hx, Family Hx, Social Hx, medications and allergies.   Physical Exam   Patient Vitals for the past 24 hrs:  BP Temp Temp src Pulse Resp SpO2 Weight  07/30/17 1916 123/68 98.5 F (36.9 C) Oral 77 20 100 % -  07/30/17 1546 114/66 98.5 F (36.9 C) Oral 96 16 100 % 138 lb (62.6 kg)  07/30/17 1544 - - - - - 100 % -   Constitutional: Well-developed, well-nourished female in no acute distress.  Cardiovascular: normal rate Respiratory: normal effort GI: Abd soft, non-tender, gravid appropriate for gestational age.  MS: Extremities nontender, no edema, normal ROM Neurologic: Alert and oriented x 4.  GU: Neg CVAT.  PELVIC EXAM: Cervix pink, visually closed, without lesion, scant white creamy discharge, vaginal walls and external genitalia normal Bimanual exam: Cervix 0/long/high, firm, anterior, neg CMT, uterus nontender, nonenlarged, adnexa without tenderness, enlargement, or mass     FHT:  Baseline 145 , moderate variability, accelerations present, no decelerations Contractions: rare, mild to palpation   Labs: Results for orders placed or performed during the hospital encounter of 07/30/17 (from the past 24 hour(s))  Urinalysis, Routine w reflex microscopic     Status: Abnormal   Collection Time: 07/30/17  3:50 PM  Result Value Ref Range   Color, Urine YELLOW YELLOW   APPearance HAZY  (A) CLEAR   Specific Gravity, Urine 1.024 1.005 - 1.030   pH 7.0 5.0 - 8.0   Glucose, UA NEGATIVE NEGATIVE mg/dL   Hgb urine dipstick NEGATIVE NEGATIVE   Bilirubin Urine NEGATIVE NEGATIVE   Ketones, ur 5 (A) NEGATIVE mg/dL   Protein, ur 30 (A) NEGATIVE mg/dL   Nitrite NEGATIVE NEGATIVE   Leukocytes, UA NEGATIVE NEGATIVE   RBC / HPF 0-5 0 - 5 RBC/hpf   WBC, UA 0-5 0 - 5 WBC/hpf   Bacteria, UA RARE (A) NONE SEEN   Squamous Epithelial / LPF 0-5 (A) NONE SEEN   Mucus PRESENT   Wet prep, genital     Status: Abnormal   Collection Time: 07/30/17  4:35 PM  Result Value Ref Range   Yeast Wet Prep HPF POC NONE SEEN NONE SEEN   Trich, Wet Prep NONE SEEN NONE SEEN   Clue Cells Wet Prep HPF POC NONE SEEN NONE SEEN   WBC, Wet Prep HPF POC FEW (A) NONE SEEN   Sperm NONE SEEN    --/--/O POS (11/14 1327)  Imaging:  No results found.  MAU Course/MDM: I have ordered labs and reviewed results.  Heart and lungs sound normal, O2 sats 100% on RA EKG with slight abnormality so consult cardiology Per Dr Donnie Aho, EKG wnl, likely lead placement causing changes, no need for f/u  SOB may be physiologic r/t pregnancy, warning signs reviewed, reasons to return to MAU  NST reviewed and reactive No evidence of preterm labor. Vaginal pain most likely fetal position. Discussed maternal positions, including hands and knees and pelvic tilts to encourage fetal position change. Mild dehydration, encouraged increased PO fluids GCC pending  Pt discharge with strict preterm labor precautions.  Today's evaluation included a work-up for preterm labor which can be life-threatening for both mom and baby.  Assessment: 1. Pelvic pain affecting pregnancy in third trimester, antepartum   2. Shortness of breath due to pregnancy in third trimester   3. Postural dizziness with presyncope   4. Palpitations     Plan: Discharge home Labor precautions and fetal kick counts  Follow-up Information    Department,  [facility] Follow up.   Why:  As scheduled for prenatal care. Return to MAU as needed for emergencies. Contact information: [address] [address] [phone]          Allergies as of 07/30/2017   No Known Allergies     Medication List    STOP taking these medications   promethazine 25 MG tablet Commonly known as:  PHENERGAN     TAKE these medications   aspirin EC 81 MG tablet Take 81 mg by mouth daily.   PRENATAL VITAMIN PO Take by mouth.  Sharen CounterLisa Leftwich-Kirby Certified Nurse-Midwife 07/30/2017 8:36 PM

## 2017-07-30 NOTE — MAU Note (Signed)
Pt presents to MAU c/o "pinching" in vagina that feels like a shock and lower abdominal cramping.  Pt reports that today she has had difficulty urinating and has had burning and pain when she urinates.  Pt reports h/o fast heart beats, and feeling like she is going to "pass out"  which coincides with SOB.  Pt has had testing and is being followed by a cardiologist this pregnancy.

## 2017-08-02 LAB — GC/CHLAMYDIA PROBE AMP (~~LOC~~) NOT AT ARMC
Chlamydia: NEGATIVE
NEISSERIA GONORRHEA: NEGATIVE

## 2017-08-10 ENCOUNTER — Ambulatory Visit: Payer: Medicaid Other | Admitting: Internal Medicine

## 2017-08-11 ENCOUNTER — Encounter: Payer: Self-pay | Admitting: *Deleted

## 2017-08-13 LAB — OB RESULTS CONSOLE RPR: RPR: NONREACTIVE

## 2017-08-13 LAB — OB RESULTS CONSOLE HIV ANTIBODY (ROUTINE TESTING): HIV: NONREACTIVE

## 2017-08-13 LAB — OB RESULTS CONSOLE GC/CHLAMYDIA
Chlamydia: NEGATIVE
GC PROBE AMP, GENITAL: NEGATIVE

## 2017-08-20 ENCOUNTER — Inpatient Hospital Stay (HOSPITAL_COMMUNITY)
Admission: AD | Admit: 2017-08-20 | Discharge: 2017-08-20 | Disposition: A | Discharge status: Home / Self Care | Payer: Medicaid Other | Source: Ambulatory Visit | Attending: Obstetrics and Gynecology | Admitting: Obstetrics and Gynecology

## 2017-08-20 ENCOUNTER — Other Ambulatory Visit: Payer: Self-pay

## 2017-08-20 ENCOUNTER — Encounter (HOSPITAL_COMMUNITY): Payer: Self-pay

## 2017-08-20 DIAGNOSIS — O26893 Other specified pregnancy related conditions, third trimester: Secondary | ICD-10-CM | POA: Diagnosis present

## 2017-08-20 DIAGNOSIS — O26899 Other specified pregnancy related conditions, unspecified trimester: Secondary | ICD-10-CM

## 2017-08-20 DIAGNOSIS — Z87891 Personal history of nicotine dependence: Secondary | ICD-10-CM | POA: Insufficient documentation

## 2017-08-20 DIAGNOSIS — O4703 False labor before 37 completed weeks of gestation, third trimester: Secondary | ICD-10-CM | POA: Diagnosis not present

## 2017-08-20 DIAGNOSIS — K5900 Constipation, unspecified: Secondary | ICD-10-CM | POA: Diagnosis not present

## 2017-08-20 DIAGNOSIS — R109 Unspecified abdominal pain: Secondary | ICD-10-CM | POA: Diagnosis present

## 2017-08-20 DIAGNOSIS — O99613 Diseases of the digestive system complicating pregnancy, third trimester: Secondary | ICD-10-CM | POA: Insufficient documentation

## 2017-08-20 DIAGNOSIS — Z3A30 30 weeks gestation of pregnancy: Secondary | ICD-10-CM | POA: Diagnosis not present

## 2017-08-20 DIAGNOSIS — M549 Dorsalgia, unspecified: Secondary | ICD-10-CM | POA: Diagnosis present

## 2017-08-20 DIAGNOSIS — R102 Pelvic and perineal pain: Secondary | ICD-10-CM

## 2017-08-20 LAB — URINALYSIS, ROUTINE W REFLEX MICROSCOPIC
Bilirubin Urine: NEGATIVE
GLUCOSE, UA: NEGATIVE mg/dL
HGB URINE DIPSTICK: NEGATIVE
Ketones, ur: NEGATIVE mg/dL
LEUKOCYTES UA: NEGATIVE
Nitrite: NEGATIVE
PH: 7 (ref 5.0–8.0)
PROTEIN: NEGATIVE mg/dL
SPECIFIC GRAVITY, URINE: 1.013 (ref 1.005–1.030)

## 2017-08-20 LAB — FETAL FIBRONECTIN: Fetal Fibronectin: NEGATIVE

## 2017-08-20 MED ORDER — DOCUSATE SODIUM 100 MG PO CAPS: 100.0000 mg | ORAL_CAPSULE | Freq: Two times a day (BID) | ORAL | 2 refills | Status: DC | PRN

## 2017-08-20 NOTE — MAU Note (Signed)
Pt c/o abdominal and back pain that started last night, rates 8/10 intermittent pain. Has had vaginal discharge, described as thick, clear mucous.

## 2017-08-20 NOTE — Discharge Instructions (Signed)

## 2017-08-20 NOTE — MAU Note (Signed)
Last night had this thick mucous coming out of her vagina.  Having bad pains in abd, back and side.  Called her dr, was told to come in and get checked out.  No bleeding, "feels really watery"

## 2017-08-20 NOTE — MAU Provider Note (Signed)
Chief Complaint:  Abdominal Pain; Back Pain; and Vaginal Discharge   First Provider Initiated Contact with Patient 08/20/17 1706      HPI: Wendy Maddox is a 26 y.o. G4P1021 at 4787w3d who presents to maternity admissions reporting pain in her left lower quadrant, intermittent, worse when walking with onset yesterday.  She reports she drank lots of water today and rested on her left side off and on and the pain continued, unchanged in intensity.  Then, she started noticing a large amount of mucus on the tissue when wiping, including a large solid mass of mucus, which she suspects was her mucus plug.  She denies any associated symptoms. She reports good fetal movement, denies LOF, vaginal bleeding, vaginal itching/burning, urinary symptoms, h/a, dizziness, n/v, or fever/chills.    HPI  Past Medical History: Past Medical History:  Diagnosis Date  . Chlamydia   . Dizziness   . Near syncope   . Ovarian cyst   . Palpitations   . Pregnancy induced hypertension     Past obstetric history: OB History  Gravida Para Term Preterm AB Living  4 1 1   2 1   SAB TAB Ectopic Multiple Live Births  1   1   1     # Outcome Date GA Lbr Len/2nd Weight Sex Delivery Anes PTL Lv  4 Current           3 Ectopic           2 SAB           1 Term     F Vag-Spont   LIV      Past Surgical History: Past Surgical History:  Procedure Laterality Date  . NO PAST SURGERIES      Family History: Family History  Problem Relation Age of Onset  . Diabetes Mother   . Bipolar disorder Mother   . Asthma Father   . Asthma Daughter   . Diabetes Maternal Grandmother   . Diabetes Paternal Grandmother     Social History: Social History   Tobacco Use  . Smoking status: Former Smoker    Packs/day: 0.25    Types: Cigarettes  . Smokeless tobacco: Never Used  . Tobacco comment: Oct 2017  Substance Use Topics  . Alcohol use: No    Frequency: Never    Comment: every other week  . Drug use: No    Allergies:  No Known Allergies  Meds:  No medications prior to admission.    ROS:  Review of Systems  Constitutional: Negative for chills, fatigue and fever.  Eyes: Negative for visual disturbance.  Respiratory: Negative for shortness of breath.   Cardiovascular: Negative for chest pain.  Gastrointestinal: Positive for abdominal pain. Negative for nausea and vomiting.  Genitourinary: Positive for pelvic pain. Negative for difficulty urinating, dysuria, flank pain, vaginal bleeding, vaginal discharge and vaginal pain.  Neurological: Negative for dizziness and headaches.  Psychiatric/Behavioral: Negative.      I have reviewed patient's Past Medical Hx, Surgical Hx, Family Hx, Social Hx, medications and allergies.   Physical Exam   Patient Vitals for the past 24 hrs:  BP Temp Temp src Pulse Resp SpO2 Weight  08/20/17 1822 111/62 - - 87 - - -  08/20/17 1626 130/78 98.6 F (37 C) Oral (!) 102 16 100 % 143 lb 12 oz (65.2 kg)   Constitutional: Well-developed, well-nourished female in no acute distress.  Cardiovascular: normal rate Respiratory: normal effort GI: Abd soft, non-tender, gravid appropriate for gestational age.  MS: Extremities nontender, no edema, normal ROM Neurologic: Alert and oriented x 4.  GU: Neg CVAT.   Dilation: Fingertip Effacement (%): 50 Exam by:: Leftwich-Kirby,CNM  FHT:  Baseline 135 , moderate variability, accelerations present, no decelerations Contractions: 1 in 1 hour on toco, mild to palpation   Labs: Results for orders placed or performed during the hospital encounter of 08/20/17 (from the past 24 hour(s))  Urinalysis, Routine w reflex microscopic     Status: Abnormal   Collection Time: 08/20/17  4:29 PM  Result Value Ref Range   Color, Urine YELLOW YELLOW   APPearance HAZY (A) CLEAR   Specific Gravity, Urine 1.013 1.005 - 1.030   pH 7.0 5.0 - 8.0   Glucose, UA NEGATIVE NEGATIVE mg/dL   Hgb urine dipstick NEGATIVE NEGATIVE   Bilirubin Urine NEGATIVE  NEGATIVE   Ketones, ur NEGATIVE NEGATIVE mg/dL   Protein, ur NEGATIVE NEGATIVE mg/dL   Nitrite NEGATIVE NEGATIVE   Leukocytes, UA NEGATIVE NEGATIVE  Fetal fibronectin     Status: None   Collection Time: 08/20/17  5:15 PM  Result Value Ref Range   Fetal Fibronectin NEGATIVE NEGATIVE      Imaging:  No results found.  MAU Course/MDM: I have ordered labs and reviewed results.  NST reviewed and reactive Cervix 1/50/-3 on exam, so FFN sent, results negative Pain is c/w round ligament pain Cervix rechecked after 1+ hour in MAU and unchanged Hard stool noted on exam D/C home with close follow up in office, return to MAU with signs of preterm labor or emergencies Colace BID PRN, increase PO fluids and fiber Rest/ice/heat/warm bath/Tylenol/pregnancy support belt for pain Pt discharge with strict preterm labor precautions.  Today's evaluation included a work-up for preterm labor which can be life-threatening for both mom and baby.  Assessment: 1. Pain of round ligament affecting pregnancy, antepartum   2. Threatened preterm labor, third trimester   3. Constipation during pregnancy in third trimester     Plan: Discharge home Labor precautions and fetal kick counts Follow-up Information    Department, [facility].   Why:  As scheduled for prenatal visits, return to MAU as needed for signs of labor or emergencies Contact information: [address] [place] [address] [phone]          Allergies as of 08/20/2017   No Known Allergies     Medication List    TAKE these medications   aspirin EC 81 MG tablet Take 81 mg by mouth daily.   docusate sodium 100 MG capsule Commonly known as:  COLACE Take 1 capsule (100 mg total) 2 (two) times daily as needed by mouth.   PRENATAL VITAMIN PO Take 1 tablet daily by mouth.       Sharen CounterLisa Leftwich-Kirby Certified Nurse-Midwife 08/20/2017 7:43 PM

## 2017-09-07 ENCOUNTER — Inpatient Hospital Stay (HOSPITAL_COMMUNITY)
Admission: AD | Admit: 2017-09-07 | Discharge: 2017-09-07 | Payer: Medicaid Other | Source: Ambulatory Visit | Attending: Family Medicine | Admitting: Family Medicine

## 2017-09-07 ENCOUNTER — Other Ambulatory Visit: Payer: Self-pay

## 2017-09-07 ENCOUNTER — Inpatient Hospital Stay
Admission: RE | Admit: 2017-09-07 | Discharge: 2017-09-11 | DRG: 805 | Disposition: A | Discharge status: Home / Self Care | Payer: Medicaid Other | Source: Ambulatory Visit | Attending: Obstetrics and Gynecology | Admitting: Obstetrics and Gynecology

## 2017-09-07 ENCOUNTER — Encounter (HOSPITAL_COMMUNITY): Payer: Self-pay

## 2017-09-07 DIAGNOSIS — Z79899 Other long term (current) drug therapy: Secondary | ICD-10-CM | POA: Diagnosis not present

## 2017-09-07 DIAGNOSIS — Z3A33 33 weeks gestation of pregnancy: Secondary | ICD-10-CM | POA: Diagnosis not present

## 2017-09-07 DIAGNOSIS — O42913 Preterm premature rupture of membranes, unspecified as to length of time between rupture and onset of labor, third trimester: Principal | ICD-10-CM | POA: Diagnosis present

## 2017-09-07 DIAGNOSIS — O09299 Supervision of pregnancy with other poor reproductive or obstetric history, unspecified trimester: Secondary | ICD-10-CM

## 2017-09-07 DIAGNOSIS — O42919 Preterm premature rupture of membranes, unspecified as to length of time between rupture and onset of labor, unspecified trimester: Secondary | ICD-10-CM

## 2017-09-07 DIAGNOSIS — Z87891 Personal history of nicotine dependence: Secondary | ICD-10-CM | POA: Insufficient documentation

## 2017-09-07 DIAGNOSIS — O3483 Maternal care for other abnormalities of pelvic organs, third trimester: Secondary | ICD-10-CM | POA: Diagnosis present

## 2017-09-07 DIAGNOSIS — Z7982 Long term (current) use of aspirin: Secondary | ICD-10-CM | POA: Insufficient documentation

## 2017-09-07 LAB — CBC
HEMATOCRIT: 36 % (ref 35.0–47.0)
HEMOGLOBIN: 11.4 g/dL — AB (ref 12.0–16.0)
MCH: 23.7 pg — AB (ref 26.0–34.0)
MCHC: 31.7 g/dL — AB (ref 32.0–36.0)
MCV: 74.8 fL — AB (ref 80.0–100.0)
Platelets: 307 10*3/uL (ref 150–440)
RBC: 4.82 MIL/uL (ref 3.80–5.20)
RDW: 14.7 % — ABNORMAL HIGH (ref 11.5–14.5)
WBC: 13.2 10*3/uL — ABNORMAL HIGH (ref 3.6–11.0)

## 2017-09-07 LAB — TYPE AND SCREEN
ABO/RH(D): O POS
ANTIBODY SCREEN: NEGATIVE

## 2017-09-07 LAB — POCT FERN TEST: POCT Fern Test: POSITIVE

## 2017-09-07 MED ORDER — SODIUM CHLORIDE 0.9 % IV SOLN
2.0000 g | Freq: Four times a day (QID) | INTRAVENOUS | Status: DC
  Administered 2017-09-07: 2 g via INTRAVENOUS
  Filled 2017-09-07 (×2): qty 2000

## 2017-09-07 MED ORDER — AZITHROMYCIN 250 MG PO TABS
500.0000 mg | ORAL_TABLET | Freq: Every day | ORAL | Status: DC
Start: 2017-09-09 — End: 2017-09-07

## 2017-09-07 MED ORDER — AMOXICILLIN 500 MG PO CAPS: 500.0000 mg | ORAL_CAPSULE | Freq: Three times a day (TID) | ORAL | Status: DC

## 2017-09-07 MED ORDER — LACTATED RINGERS IV SOLN
INTRAVENOUS | Status: DC
  Administered 2017-09-08: 125 mL/h via INTRAVENOUS
  Administered 2017-09-08 (×2): via INTRAVENOUS

## 2017-09-07 MED ORDER — ERYTHROMYCIN BASE 250 MG PO TABS: 250.0000 mg | ORAL_TABLET | Freq: Four times a day (QID) | ORAL | Status: DC

## 2017-09-07 MED ORDER — SODIUM CHLORIDE 0.9 % IV SOLN
2.0000 g | Freq: Four times a day (QID) | INTRAVENOUS | Status: DC
  Administered 2017-09-08 – 2017-09-09 (×6): 2 g via INTRAVENOUS
  Filled 2017-09-07 (×8): qty 2000

## 2017-09-07 MED ORDER — ACETAMINOPHEN 650 MG RE SUPP
650.0000 mg | Freq: Four times a day (QID) | RECTAL | Status: DC | PRN
  Filled 2017-09-07 (×2): qty 1

## 2017-09-07 MED ORDER — AZITHROMYCIN 500 MG IV SOLR
500.0000 mg | INTRAVENOUS | Status: DC
  Filled 2017-09-07: qty 500

## 2017-09-07 MED ORDER — AZITHROMYCIN 500 MG PO TABS
1000.0000 mg | ORAL_TABLET | Freq: Once | ORAL | Status: AC
  Administered 2017-09-07: 1000 mg via ORAL
  Filled 2017-09-07 (×3): qty 2

## 2017-09-07 MED ORDER — ONDANSETRON HCL 4 MG/2ML IJ SOLN
4.0000 mg | Freq: Four times a day (QID) | INTRAMUSCULAR | Status: DC | PRN
  Administered 2017-09-08: 4 mg via INTRAVENOUS
  Filled 2017-09-07: qty 2

## 2017-09-07 MED ORDER — ACETAMINOPHEN 325 MG PO TABS
650.0000 mg | ORAL_TABLET | Freq: Four times a day (QID) | ORAL | Status: DC | PRN
  Administered 2017-09-08: 650 mg via ORAL
  Filled 2017-09-07: qty 2

## 2017-09-07 MED ORDER — ERYTHROMYCIN LACTOBIONATE 500 MG IV SOLR: 250.0000 mg | Freq: Four times a day (QID) | INTRAVENOUS | Status: DC

## 2017-09-07 MED ORDER — LACTATED RINGERS IV SOLN
INTRAVENOUS | Status: DC
  Administered 2017-09-07: 18:00:00 via INTRAVENOUS

## 2017-09-07 MED ORDER — BETAMETHASONE SOD PHOS & ACET 6 (3-3) MG/ML IJ SUSP
12.0000 mg | Freq: Once | INTRAMUSCULAR | Status: AC
  Administered 2017-09-07: 12 mg via INTRAMUSCULAR
  Filled 2017-09-07: qty 2

## 2017-09-07 MED ORDER — SODIUM CHLORIDE 0.9 % IV SOLN
2.0000 g | Freq: Four times a day (QID) | INTRAVENOUS | Status: DC
  Filled 2017-09-07: qty 2000

## 2017-09-07 MED ORDER — ONDANSETRON HCL 4 MG PO TABS: 4.0000 mg | ORAL_TABLET | Freq: Four times a day (QID) | ORAL | Status: DC | PRN

## 2017-09-07 NOTE — MAU Provider Note (Signed)
History     CSN: [id number]  Arrival date and time: 09/07/17 1520   First Provider Initiated Contact with Patient 09/07/17 1555      Chief Complaint  Patient presents with  . Rupture of Membranes   HPI  Ms. Wendy Maddox is a 26 y.o. [id number] at 4435w0d gestation sent to MAU by request of Dr. Penne LashLeggett for evaluation of PPROM. She states she was at work and felt a sudden gush of fluid run down her legs at 1330. [facility] and was then sent here.  She denies any VB. She reports good (+) FM.  Past Medical History:  Diagnosis Date  . Chlamydia   . Dizziness   . Near syncope   . Ovarian cyst   . Palpitations   . Pregnancy induced hypertension     Past Surgical History:  Procedure Laterality Date  . NO PAST SURGERIES      Family History  Problem Relation Age of Onset  . Diabetes Mother   . Bipolar disorder Mother   . Asthma Father   . Asthma Daughter   . Diabetes Maternal Grandmother   . Diabetes Paternal Grandmother     Social History   Tobacco Use  . Smoking status: Former Smoker    Packs/day: 0.25    Types: Cigarettes  . Smokeless tobacco: Never Used  . Tobacco comment: Oct 2017  Substance Use Topics  . Alcohol use: No    Frequency: Never    Comment: every other week  . Drug use: No    Allergies: No Known Allergies  Medications Prior to Admission  Medication Sig Dispense Refill Last Dose  . aspirin EC 81 MG tablet Take 81 mg by mouth daily.   Past Week at Unknown time  . docusate sodium (COLACE) 100 MG capsule Take 1 capsule (100 mg total) 2 (two) times daily as needed by mouth. 30 capsule 2   . Prenatal Vit-Fe Fumarate-FA (PRENATAL VITAMIN PO) Take 1 tablet daily by mouth.    08/20/2017 at Unknown time    Review of Systems  Constitutional: Negative.   HENT: Negative.   Eyes: Negative.   Respiratory: Negative.   Cardiovascular: Negative.   Gastrointestinal: Negative.   Endocrine: Negative.   Genitourinary: Positive  for vaginal discharge ("lots of fluid gushing since 1330 today"). Negative for pelvic pain and vaginal bleeding.  Musculoskeletal: Negative.   Skin: Negative.   Allergic/Immunologic: Negative.   Neurological: Negative.   Hematological: Negative.   Psychiatric/Behavioral: Negative.    Physical Exam   Blood pressure 115/71, pulse 98, temperature 98.6 F (37 C), temperature source Oral, resp. rate 18, height 5\' 2"  (1.575 m), weight 68 kg (150 lb), last menstrual period 01/19/2017, SpO2 98 %.  Physical Exam  Nursing note and vitals reviewed. Constitutional: She is oriented to person, place, and time. She appears well-developed and well-nourished.  HENT:  Head: Normocephalic.  Eyes: Pupils are equal, round, and reactive to light.  Neck: Normal range of motion.  Cardiovascular: Normal rate, regular rhythm, normal heart sounds and intact distal pulses.  Respiratory: Effort normal.  GI: Soft. Bowel sounds are normal.  Genitourinary:  Genitourinary Comments: Uterus: gravid, S=D, cx: smooth, pink, no lesions, copious amt of clear fluid with vernix pouring out of speculum -- fluid cleared out with 6 large swabs, white vaginal d/c, cervix is visually 1 cm/long -- no prolapsed cord   Musculoskeletal: Normal range of motion.  Neurological: She is alert and oriented to person, place,  and time. She has normal reflexes.  Skin: Skin is warm and dry.  Psychiatric: She has a normal mood and affect. Her behavior is normal. Judgment and thought content normal.   Vertex verified by Leopold's maneuvers and informal bedside U/S  MAU Course  Procedures  MDM Fern test Sterile Speculum  GBS -- pending BMZ 12 mg IM injection NST - FHR: 135 bpm / moderate variability / accels present / decels absent / TOCO: 1 UC noted *Consult with Dr. Penne LashLeggett @ 1620 - notified of patient's PPROM - orders received to perform SSE, visualize cervix, verify presentation with BS U/S, GBS, IV abx. TC @ 1630 from Dr. Penne LashLeggett  notifying of transfer acceptance by Dr. Feliberto GottronSchermerhorn                                                                                                                   Results for orders placed or performed during the hospital encounter of 09/07/17 (from the past 24 hour(s))  Fern Test     Status: None   Collection Time: 09/07/17  3:53 PM  Result Value Ref Range   POCT Fern Test Positive = ruptured amniotic membanes      Assessment and Plan  26 yo [id number] PPROM @ 33.[redacted] wk gestation [facility] -- Dr. Feliberto GottronSchermerhorn accepting  Wendy Moraolitta Darian Cansler, MSN, CNM 09/07/2017, 4:19 PM

## 2017-09-07 NOTE — MAU Note (Signed)
Pt presents to MAU with c/o of PROM 1330. [facility] following. Pt denies bleeding. +FM

## 2017-09-07 NOTE — MAU Note (Signed)
[facility] here for transport.  

## 2017-09-07 NOTE — H&P (Signed)
Christophe LouisMikalah Laural BenesJohnson is a 26 y.o. female [phone] at 6933 weeks dated by 5 1/7 week scan with EDD of 10/26/17 [facility] due to PPROM at 1330 today for clear fluid and no labor S/S. PNC significant for +ferning slide, spec exam with closed cx and clear fluid at the post vault. Pt transferred here due to no NICU beds available.  OB History    Gravida Para Term Preterm AB Living   4 1 1   2 1    SAB TAB Ectopic Multiple Live Births   1   1   1      Past Medical History:  Diagnosis Date  . Chlamydia   . Dizziness   . Near syncope   . Ovarian cyst   . Palpitations   . Pregnancy induced hypertension    Past Surgical History:  Procedure Laterality Date  . NO PAST SURGERIES     Family History: family history includes Asthma in her daughter and father; Bipolar disorder in her mother; Diabetes in her maternal grandmother, mother, and paternal grandmother. Social History:  reports that she has quit smoking. Her smoking use included cigarettes. She smoked 0.25 packs per day. she has never used smokeless tobacco. She reports that she does not drink alcohol or use drugs.     Review of Systems  Constitutional: Negative.   HENT: Negative.   Eyes: Negative.   Respiratory: Negative.   Cardiovascular: Negative.   Gastrointestinal: Negative.   Genitourinary: Negative.   Musculoskeletal: Negative.   Skin: Negative.   Neurological: Negative.   Endo/Heme/Allergies: Negative.   Psychiatric/Behavioral: Negative.   WJX:BJYNWGNGYN:leaking scant clear fluid [facility] History   Blood pressure (!) 105/57, pulse 80, temperature 98.4 F (36.9 C), temperature source Oral, resp. rate 18, last menstrual period 01/19/2017, unknown if currently breastfeeding. Exam Physical Exam  Gen:26 yo black female in NAD Resp:reg and non-labored Cardiac:S1S2, RRR, No M/R/G FAO:ZHYQMVAbd:Gravid GYN;clear fluid leaking HQ:IONGEXBCx:visibly closed per [place] prior to  transfer Prenatal labs: ABO, Rh:O pos Antibody: Neg Rubella:  Neg  RPR:   NR HBsAg:   Neg HIV:   Neg GBS:   unknown  Varicella:Immune Assessment/Plan: A:1. IUP at 33 weeks PPROM 2. Th PTL and PTD P: Ampicillin 2 gms IV q 6 hours and Azithromycin 1000 mg po x 1. 2. BMZ 2nd dose due tomorrow by 1600. 3. If labor begins with UC's, will use MagSo4 per PPROM per ACOG. 4. Dr TJSchermerhorn aware of pt status and agrees with plan of care.   Sharee Pimplearon W Anneke Cundy 09/07/2017, 10:07 PM

## 2017-09-07 NOTE — MAU Note (Signed)
Urine sent to lab 

## 2017-09-08 ENCOUNTER — Other Ambulatory Visit: Payer: Self-pay

## 2017-09-08 DIAGNOSIS — O09299 Supervision of pregnancy with other poor reproductive or obstetric history, unspecified trimester: Secondary | ICD-10-CM

## 2017-09-08 LAB — COMPREHENSIVE METABOLIC PANEL
ALBUMIN: 2.8 g/dL — AB (ref 3.5–5.0)
ALT: 15 U/L (ref 14–54)
ANION GAP: 7 (ref 5–15)
AST: 27 U/L (ref 15–41)
Alkaline Phosphatase: 78 U/L (ref 38–126)
BUN: 6 mg/dL (ref 6–20)
CO2: 19 mmol/L — AB (ref 22–32)
Calcium: 8.4 mg/dL — ABNORMAL LOW (ref 8.9–10.3)
Chloride: 108 mmol/L (ref 101–111)
Creatinine, Ser: 0.51 mg/dL (ref 0.44–1.00)
GFR calc non Af Amer: >60 mL/min (ref >60)
GLUCOSE: 189 mg/dL — AB (ref 65–99)
Potassium: 3.8 mmol/L (ref 3.5–5.1)
SODIUM: 134 mmol/L — AB (ref 135–145)
TOTAL PROTEIN: 6.1 g/dL — AB (ref 6.5–8.1)
Total Bilirubin: 0.1 mg/dL — ABNORMAL LOW (ref 0.3–1.2)

## 2017-09-08 LAB — CBC
HEMATOCRIT: 32.6 % — AB (ref 35.0–47.0)
HEMOGLOBIN: 10.4 g/dL — AB (ref 12.0–16.0)
MCH: 23.9 pg — ABNORMAL LOW (ref 26.0–34.0)
MCHC: 31.8 g/dL — ABNORMAL LOW (ref 32.0–36.0)
MCV: 75.1 fL — ABNORMAL LOW (ref 80.0–100.0)
Platelets: 293 10*3/uL (ref 150–440)
RBC: 4.34 MIL/uL (ref 3.80–5.20)
RDW: 14.5 % (ref 11.5–14.5)
WBC: 13.2 10*3/uL — AB (ref 3.6–11.0)

## 2017-09-08 MED ORDER — OXYTOCIN 10 UNIT/ML IJ SOLN
INTRAMUSCULAR | Status: AC
  Filled 2017-09-08: qty 2

## 2017-09-08 MED ORDER — LACTATED RINGERS IV SOLN
500.0000 mL | INTRAVENOUS | Status: DC | PRN
  Administered 2017-09-09: 500 mL via INTRAVENOUS

## 2017-09-08 MED ORDER — OXYTOCIN 40 UNITS IN LACTATED RINGERS INFUSION - SIMPLE MED: 2.5000 [IU]/h | INTRAVENOUS | Status: DC

## 2017-09-08 MED ORDER — SOD CITRATE-CITRIC ACID 500-334 MG/5ML PO SOLN
30.0000 mL | ORAL | Status: DC | PRN
  Administered 2017-09-09: 30 mL via ORAL
  Filled 2017-09-08: qty 15

## 2017-09-08 MED ORDER — BUTORPHANOL TARTRATE 1 MG/ML IJ SOLN
1.0000 mg | INTRAMUSCULAR | Status: DC | PRN
  Administered 2017-09-09 (×3): 1 mg via INTRAVENOUS
  Filled 2017-09-08 (×3): qty 1

## 2017-09-08 MED ORDER — OXYTOCIN 40 UNITS IN LACTATED RINGERS INFUSION - SIMPLE MED
INTRAVENOUS | Status: AC
  Administered 2017-09-08: 1 m[IU]/min
  Filled 2017-09-08: qty 1000

## 2017-09-08 MED ORDER — LACTATED RINGERS IV SOLN
INTRAVENOUS | Status: DC
  Administered 2017-09-09: 05:00:00 via INTRAVENOUS

## 2017-09-08 MED ORDER — LIDOCAINE HCL (PF) 1 % IJ SOLN
INTRAMUSCULAR | Status: AC
  Filled 2017-09-08: qty 30

## 2017-09-08 MED ORDER — MISOPROSTOL 200 MCG PO TABS
ORAL_TABLET | ORAL | Status: AC
  Filled 2017-09-08: qty 4

## 2017-09-08 MED ORDER — ONDANSETRON HCL 4 MG/2ML IJ SOLN: 4.0000 mg | Freq: Four times a day (QID) | INTRAMUSCULAR | Status: DC | PRN

## 2017-09-08 MED ORDER — TERBUTALINE SULFATE 1 MG/ML IJ SOLN: 0.2500 mg | Freq: Once | INTRAMUSCULAR | Status: DC | PRN

## 2017-09-08 MED ORDER — OXYTOCIN 40 UNITS IN LACTATED RINGERS INFUSION - SIMPLE MED
1.0000 m[IU]/min | INTRAVENOUS | Status: DC
  Administered 2017-09-08: 2 m[IU]/min via INTRAVENOUS

## 2017-09-08 MED ORDER — LIDOCAINE HCL (PF) 1 % IJ SOLN
30.0000 mL | INTRAMUSCULAR | Status: DC | PRN
  Filled 2017-09-08: qty 30

## 2017-09-08 MED ORDER — OXYTOCIN BOLUS FROM INFUSION
500.0000 mL | Freq: Once | INTRAVENOUS | Status: AC
  Administered 2017-09-09: 500 mL via INTRAVENOUS

## 2017-09-08 MED ORDER — BETAMETHASONE SOD PHOS & ACET 6 (3-3) MG/ML IJ SUSP
12.0000 mg | Freq: Once | INTRAMUSCULAR | Status: AC
  Administered 2017-09-08: 12 mg via INTRAMUSCULAR
  Filled 2017-09-08: qty 2

## 2017-09-08 MED ORDER — AMMONIA AROMATIC IN INHA
RESPIRATORY_TRACT | Status: AC
  Filled 2017-09-08: qty 10

## 2017-09-08 MED ORDER — ACETAMINOPHEN 500 MG PO TABS
1000.0000 mg | ORAL_TABLET | Freq: Four times a day (QID) | ORAL | Status: DC | PRN
  Administered 2017-09-10: 1000 mg via ORAL
  Filled 2017-09-08: qty 2

## 2017-09-08 NOTE — Progress Notes (Signed)
Change of status  Patient was complaining of persistent low back pain.  She does not feel well, much different than she did this morning when we met.  She also complains of feeling hot/flush and chills.  O: BP 117/60 (BP Location: Right Arm)   Pulse 67   Temp 98.8 F (37.1 C) (Oral)   Resp 18   Ht 5' 2"  (1.575 m)   Wt 68 kg (150 lb)   LMP 01/19/2017   SpO2 100%   BMI 27.44 kg/m   FHT: 129 mod + accels no decels Toco: quiet SSE: +clear fluid pool, no visible dilation.   Brief sterile SVE performed, cervix is closed, anterior and soft.  On exam, uterine fundus is tender. Ultrasound shows cephalic presentation ROT, and minimal fluid surrounding fetus.  A/P: 26yo [id number] @ 33+1 with PPROM and clinical signs of developing ascending infection.  1. Category 1  2. Will proceed with induction given change in status.   She is s/p BMZ today so by nature her WBC is expected to be elevated, thus not a clear marker for presence of infection.   She is already receiving IV antibiotics, and her GBS is not back yet.  Will continue this.  Due to low fluid, will start with a contraction stress test, and proceed from there.    ----- Larey Days, MD Attending Obstetrician and Gynecologist [facility], Department of [facility]

## 2017-09-08 NOTE — Progress Notes (Signed)
ANTEPARTUM PROGRESS NOTE  Wendy Maddox is a 26 y.o. [id number] [facility] Date of Delivery: 10/26/17 who is admitted for PPROM @ 33 weeks. [facility] was yesterday, [facility] due to high NICU census she was transferred here for management.    Length of Stay:  2 Days. Admitted 09/07/2017  Subjective: Patient reports good fetal movement.  She reports no uterine contractions, no bleeding and no fever, chills, or abdominal tenderness.   Vitals:  BP (!) 105/56   Pulse 74   Temp 97.9 F (36.6 C) (Oral)   Resp 18   Ht 5' 2"  (1.575 m)   Wt 68 kg (150 lb)   LMP 01/19/2017   BMI 27.44 kg/m  Physical Examination: CONSTITUTIONAL: Well-developed, well-nourished female in no acute distress.  HENT:  Normocephalic, atraumatic, External right and left ear normal. Oropharynx is clear and moist EYES: Conjunctivae and EOM are normal. Pupils are equal, round, and reactive to light. No scleral icterus.  NECK: Normal range of motion, supple, no masses SKIN: Skin is warm and dry. No rash noted. Not diaphoretic. No erythema. No pallor. NRackerby Alert and oriented to person, place, and time. Normal reflexes, muscle tone coordination. No cranial nerve deficit noted. PSYCHIATRIC: Normal mood and affect. Normal behavior. Normal judgment and thought content. CARDIOVASCULAR: Normal heart rate noted, regular rhythm RESPIRATORY: Effort and breath sounds normal, no problems with respiration noted, lungs clear to ascultation bilaterally MUSCULOSKELETAL: Normal range of motion. No edema and no tenderness. 2+ distal pulses. ABDOMEN: Soft, nontender, nondistended, gravid. CERVIX:  deferred  Fetal monitoring: 125 mod + accels no decels Uterine activity:  Irritable  Results for orders placed or performed during the hospital encounter of 09/07/17 (from the past 48 hour(s))  CBC     Status: Abnormal   Collection Time: 09/07/17  8:43 PM  Result Value Ref Range   WBC 13.2 (H) 3.6 -  11.0 K/uL   RBC 4.82 3.80 - 5.20 MIL/uL   Hemoglobin 11.4 (L) 12.0 - 16.0 g/dL   HCT 36.0 35.0 - 47.0 %   MCV 74.8 (L) 80.0 - 100.0 fL   MCH 23.7 (L) 26.0 - 34.0 pg   MCHC 31.7 (L) 32.0 - 36.0 g/dL   RDW 14.7 (H) 11.5 - 14.5 %   Platelets 307 150 - 440 K/uL  Type and screen     Status: None   Collection Time: 09/07/17  8:43 PM  Result Value Ref Range   ABO/RH(D) O POS    Antibody Screen NEG    Sample Expiration 09/10/2017   Comprehensive metabolic panel     Status: Abnormal   Collection Time: 09/08/17  6:08 AM  Result Value Ref Range   Sodium 134 (L) 135 - 145 mmol/L   Potassium 3.8 3.5 - 5.1 mmol/L   Chloride 108 101 - 111 mmol/L   CO2 19 (L) 22 - 32 mmol/L   Glucose, Bld 189 (H) 65 - 99 mg/dL   BUN 6 6 - 20 mg/dL   Creatinine, Ser 0.51 0.44 - 1.00 mg/dL   Calcium 8.4 (L) 8.9 - 10.3 mg/dL   Total Protein 6.1 (L) 6.5 - 8.1 g/dL   Albumin 2.8 (L) 3.5 - 5.0 g/dL   AST 27 15 - 41 U/L   ALT 15 14 - 54 U/L   Alkaline Phosphatase 78 38 - 126 U/L   Total Bilirubin 0.1 (L) 0.3 - 1.2 mg/dL   GFR calc non Af Amer >60 >60 mL/min  GFR calc Af Amer >60 >60 mL/min    Comment: (NOTE) The eGFR has been calculated using the CKD EPI equation. This calculation has not been validated in all clinical situations. eGFR's persistently <60 mL/min signify possible Chronic Kidney Disease.    Anion gap 7 5 - 15  CBC     Status: Abnormal   Collection Time: 09/08/17  7:43 AM  Result Value Ref Range   WBC 13.2 (H) 3.6 - 11.0 K/uL   RBC 4.34 3.80 - 5.20 MIL/uL   Hemoglobin 10.4 (L) 12.0 - 16.0 g/dL   HCT 32.6 (L) 35.0 - 47.0 %   MCV 75.1 (L) 80.0 - 100.0 fL   MCH 23.9 (L) 26.0 - 34.0 pg   MCHC 31.8 (L) 32.0 - 36.0 g/dL   RDW 14.5 11.5 - 14.5 %   Platelets 293 150 - 440 K/uL    No results found.  Current scheduled medications . ammonia      . lidocaine (PF)      . misoprostol      . oxytocin        I have reviewed the patient's current medications.  ASSESSMENT: Patient Active  Problem List   Diagnosis Date Noted  . History of pre-eclampsia in prior pregnancy, currently pregnant 09/08/2017  . Preterm premature rupture of membranes (PPROM) with unknown onset of labor 09/07/2017    PLAN: 1. IUP: category 1  BMP x 1 given yesterday, due today  2. PPROM: s/p Azithro x1, on ampicillin 24hr, needs 48hrs.  After this will transition to PO amox x 5 days.  Plan to deliver at 34wks unless otherwise indicated.   Can transfer to antepartum, will need FHR q shift and NST daily.    ----- Larey Days, MD Attending Obstetrician and Gynecologist [facility]

## 2017-09-08 NOTE — Progress Notes (Signed)
Pt continues to complain with lower back pain and pressure. Dr. Elesa MassedWard notified and pt transferred to L&D

## 2017-09-08 NOTE — Progress Notes (Signed)
Christophe LouisMikalah Laural BenesJohnson is a 26 y.o. [id number] at 8826w1d by early US  admitted for PPROM at 1330 on 09/07/17 with first dose of BMZ at 14[facility] [place]. [facility]  Subjective: Sleeping  Objective: BP (!) 104/53   Pulse 67   Temp (!) 97.5 F (36.4 C) Comment: pt. recently drank cold juice  Resp 18   Ht 5\' 2"  (1.575 m)   Wt 68 kg (150 lb)   LMP 01/19/2017   BMI 27.44 kg/m  I/O last 3 completed shifts: In: -  Out: 52 [Urine:1; Emesis/NG output:50; Stool:1] No intake/output data recorded.  FHT: 120, +accels, no decels, Cat 1 UC: Rare SVE:  Closed on spec exam on 09/07/17    Labs: Lab Results  Component Value Date   WBC 13.2 (H) 09/07/2017   HGB 11.4 (L) 09/07/2017   HCT 36.0 09/07/2017   MCV 74.8 (L) 09/07/2017   PLT 307 09/07/2017    Assessment / Plan: A:1. IUP at 33 1/7 weeks 2. PPROM x 18.5 hours 3. GBS unknown P:1. BMZ 1st dose given at 1430 on 09/07/17 2. No S/S of labor (pt slept all night) 3. MagSO4 if pt starts to labor (pt is above 32 weeks and neuroprotection is not mandated for fetal age) 4. Report to Dr Elesa MassedWard at [phone].   Sharee Pimplearon W Jones 09/08/2017, 7:22 AM

## 2017-09-09 ENCOUNTER — Inpatient Hospital Stay: Payer: Medicaid Other | Admitting: Anesthesiology

## 2017-09-09 LAB — CHLAMYDIA/NGC RT PCR (ARMC ONLY)
Chlamydia Tr: NOT DETECTED
N GONORRHOEAE: NOT DETECTED

## 2017-09-09 LAB — CBC
HCT: 32.8 % — ABNORMAL LOW (ref 35.0–47.0)
HEMOGLOBIN: 10.5 g/dL — AB (ref 12.0–16.0)
MCH: 24 pg — AB (ref 26.0–34.0)
MCHC: 32.1 g/dL (ref 32.0–36.0)
MCV: 74.9 fL — AB (ref 80.0–100.0)
Platelets: 279 10*3/uL (ref 150–440)
RBC: 4.38 MIL/uL (ref 3.80–5.20)
RDW: 14.7 % — ABNORMAL HIGH (ref 11.5–14.5)
WBC: 14.6 10*3/uL — ABNORMAL HIGH (ref 3.6–11.0)

## 2017-09-09 LAB — CULTURE, BETA STREP (GROUP B ONLY)

## 2017-09-09 LAB — RAPID HIV SCREEN (HIV 1/2 AB+AG)
HIV 1/2 Antibodies: NONREACTIVE
HIV-1 P24 Antigen - HIV24: NONREACTIVE

## 2017-09-09 MED ORDER — IBUPROFEN 600 MG PO TABS
600.0000 mg | ORAL_TABLET | Freq: Four times a day (QID) | ORAL | Status: DC
  Administered 2017-09-09 – 2017-09-11 (×8): 600 mg via ORAL
  Filled 2017-09-09 (×9): qty 1

## 2017-09-09 MED ORDER — SIMETHICONE 80 MG PO CHEW: 80.0000 mg | CHEWABLE_TABLET | ORAL | Status: DC | PRN

## 2017-09-09 MED ORDER — EPHEDRINE 5 MG/ML INJ
10.0000 mg | INTRAVENOUS | Status: DC | PRN
  Filled 2017-09-09: qty 2

## 2017-09-09 MED ORDER — FENTANYL 2.5 MCG/ML W/ROPIVACAINE 0.15% IN NS 100 ML EPIDURAL (ARMC)
EPIDURAL | Status: AC
  Filled 2017-09-09: qty 100

## 2017-09-09 MED ORDER — OXYTOCIN 10 UNIT/ML IJ SOLN
INTRAMUSCULAR | Status: AC
  Filled 2017-09-09: qty 2

## 2017-09-09 MED ORDER — ZOLPIDEM TARTRATE 5 MG PO TABS: 5.0000 mg | ORAL_TABLET | Freq: Every evening | ORAL | Status: DC | PRN

## 2017-09-09 MED ORDER — MISOPROSTOL 200 MCG PO TABS
ORAL_TABLET | ORAL | Status: AC
  Filled 2017-09-09: qty 4

## 2017-09-09 MED ORDER — SODIUM CHLORIDE 0.9 % IV SOLN
INTRAVENOUS | Status: DC | PRN
  Administered 2017-09-09 (×3): 5 mL via EPIDURAL

## 2017-09-09 MED ORDER — PHENYLEPHRINE 40 MCG/ML (10ML) SYRINGE FOR IV PUSH (FOR BLOOD PRESSURE SUPPORT)
80.0000 ug | PREFILLED_SYRINGE | INTRAVENOUS | Status: DC | PRN
  Filled 2017-09-09: qty 5

## 2017-09-09 MED ORDER — MAGNESIUM HYDROXIDE 400 MG/5ML PO SUSP: 30.0000 mL | ORAL | Status: DC | PRN

## 2017-09-09 MED ORDER — SENNOSIDES-DOCUSATE SODIUM 8.6-50 MG PO TABS
2.0000 | ORAL_TABLET | ORAL | Status: DC
  Administered 2017-09-10: 2 via ORAL
  Filled 2017-09-09: qty 2

## 2017-09-09 MED ORDER — ACETAMINOPHEN 325 MG PO TABS: 650.0000 mg | ORAL_TABLET | ORAL | Status: DC | PRN

## 2017-09-09 MED ORDER — WITCH HAZEL-GLYCERIN EX PADS: 1.0000 "application " | MEDICATED_PAD | CUTANEOUS | Status: DC | PRN

## 2017-09-09 MED ORDER — ONDANSETRON HCL 4 MG PO TABS: 4.0000 mg | ORAL_TABLET | ORAL | Status: DC | PRN

## 2017-09-09 MED ORDER — COCONUT OIL OIL: 1.0000 "application " | TOPICAL_OIL | Status: DC | PRN

## 2017-09-09 MED ORDER — FERROUS SULFATE 325 (65 FE) MG PO TABS
325.0000 mg | ORAL_TABLET | Freq: Two times a day (BID) | ORAL | Status: DC
  Administered 2017-09-09 – 2017-09-11 (×4): 325 mg via ORAL
  Filled 2017-09-09 (×4): qty 1

## 2017-09-09 MED ORDER — FENTANYL 2.5 MCG/ML W/ROPIVACAINE 0.15% IN NS 100 ML EPIDURAL (ARMC)
12.0000 mL/h | EPIDURAL | Status: DC
  Administered 2017-09-09: 12 mL/h via EPIDURAL

## 2017-09-09 MED ORDER — LACTATED RINGERS IV SOLN: 500.0000 mL | Freq: Once | INTRAVENOUS | Status: AC

## 2017-09-09 MED ORDER — MEASLES, MUMPS & RUBELLA VAC ~~LOC~~ INJ
0.5000 mL | INJECTION | Freq: Once | SUBCUTANEOUS | Status: DC
  Filled 2017-09-09: qty 0.5

## 2017-09-09 MED ORDER — DIPHENHYDRAMINE HCL 50 MG/ML IJ SOLN: 12.5000 mg | INTRAMUSCULAR | Status: DC | PRN

## 2017-09-09 MED ORDER — DIBUCAINE 1 % RE OINT: 1.0000 "application " | TOPICAL_OINTMENT | RECTAL | Status: DC | PRN

## 2017-09-09 MED ORDER — LIDOCAINE-EPINEPHRINE (PF) 1.5 %-1:200000 IJ SOLN
INTRAMUSCULAR | Status: DC | PRN
  Administered 2017-09-09: 3 mL

## 2017-09-09 MED ORDER — BENZOCAINE-MENTHOL 20-0.5 % EX AERO: 1.0000 "application " | INHALATION_SPRAY | CUTANEOUS | Status: DC | PRN

## 2017-09-09 MED ORDER — AMMONIA AROMATIC IN INHA
RESPIRATORY_TRACT | Status: AC
  Filled 2017-09-09: qty 10

## 2017-09-09 MED ORDER — DIPHENHYDRAMINE HCL 25 MG PO CAPS: 25.0000 mg | ORAL_CAPSULE | Freq: Four times a day (QID) | ORAL | Status: DC | PRN

## 2017-09-09 MED ORDER — ONDANSETRON HCL 4 MG/2ML IJ SOLN: 4.0000 mg | INTRAMUSCULAR | Status: DC | PRN

## 2017-09-09 MED ORDER — PRENATAL MULTIVITAMIN CH
1.0000 | ORAL_TABLET | Freq: Every day | ORAL | Status: DC
  Administered 2017-09-09 – 2017-09-10 (×2): 1 via ORAL
  Filled 2017-09-09 (×2): qty 1

## 2017-09-09 NOTE — Anesthesia Preprocedure Evaluation (Signed)
Anesthesia Evaluation  Patient identified by MRN, date of birth, ID band Patient awake    Reviewed: Allergy & Precautions, NPO status , Patient's Chart, lab work & pertinent test results  History of Anesthesia Complications Negative for: history of anesthetic complications  Airway Mallampati: II  TM Distance: >3 FB Neck ROM: Full    Dental no notable dental hx.    Pulmonary neg sleep apnea, neg COPD, former smoker,    breath sounds clear to auscultation- rhonchi (-) wheezing      Cardiovascular Exercise Tolerance: Good Hypertension: PIH during prior pregnancy. (-) CAD, (-) Past MI, (-) Cardiac Stents and (-) CABG  Rhythm:Regular Rate:Normal - Systolic murmurs and - Diastolic murmurs    Neuro/Psych negative neurological ROS  negative psych ROS   GI/Hepatic negative GI ROS, Neg liver ROS,   Endo/Other  negative endocrine ROSneg diabetes  Renal/GU negative Renal ROS     Musculoskeletal negative musculoskeletal ROS (+)   Abdominal (+) - obese, Gravid abdomen  Peds  Hematology negative hematology ROS (+)   Anesthesia Other Findings   Reproductive/Obstetrics (+) Pregnancy                             Lab Results  Component Value Date   WBC 14.6 (H) 09/09/2017   HGB 10.5 (L) 09/09/2017   HCT 32.8 (L) 09/09/2017   MCV 74.9 (L) 09/09/2017   PLT 279 09/09/2017    Anesthesia Physical Anesthesia Plan  ASA: II  Anesthesia Plan: Epidural   Post-op Pain Management:    Induction:   PONV Risk Score and Plan: 2  Airway Management Planned:   Additional Equipment:   Intra-op Plan:   Post-operative Plan:   Informed Consent: I have reviewed the patients History and Physical, chart, labs and discussed the procedure including the risks, benefits and alternatives for the proposed anesthesia with the patient or authorized representative who has indicated his/her understanding and acceptance.      Plan Discussed with: CRNA and Anesthesiologist  Anesthesia Plan Comments: (Plan for epidural for labor, discussed epidural vs spinal vs GA if need for csection)        Anesthesia Quick Evaluation

## 2017-09-09 NOTE — Plan of Care (Signed)
Discussed plan of care with Patient, verbalized understanding and agreed to plan.

## 2017-09-09 NOTE — Anesthesia Procedure Notes (Signed)
Epidural Patient location during procedure: OB Start time: 09/09/2017 7:52 AM End time: 09/09/2017 8:04 AM  Staffing Anesthesiologist: Alver FisherPenwarden, Aveline Daus, MD Performed: anesthesiologist   Preanesthetic Checklist Completed: patient identified, site marked, surgical consent, pre-op evaluation, timeout performed, IV checked, risks and benefits discussed and monitors and equipment checked  Epidural Patient position: sitting Prep: ChloraPrep Patient monitoring: heart rate, continuous pulse ox and blood pressure Approach: midline Location: L3-L4 Injection technique: LOR saline  Needle:  Needle type: Tuohy  Needle gauge: 18 G Needle length: 9 cm and 9 Needle insertion depth: 5.5 cm Catheter type: closed end flexible Catheter size: 20 Guage Catheter at skin depth: 10 cm Test dose: negative (0.125% bupivacaine)  Assessment Events: blood not aspirated, injection not painful, no injection resistance, negative IV test and no paresthesia  Additional Notes   Patient tolerated the insertion well without complications.Reason for block:procedure for pain

## 2017-09-09 NOTE — Progress Notes (Signed)
Wendy Maddox is a 26 y.o. G4P1021 at 5448w2d PPROM HD#3 on ampicillin and s/p azithromycin .  induction for possible early chorio( Dr Elesa MassedWard)  Wendy Maddox in place and pt comfortable .  Patient Lines/Drains/Airways Status   Active Line/Drains/Airways    Name:   Placement date:   Placement time:   Site:   Days:   Peripheral IV 09/07/17 Left;Posterior Hand   09/07/17    1705    Hand   2   Epidural Catheter 09/09/17   09/09/17    0752     less than 1   Urethral Catheter Rosenberg RN Double-lumen;Non-latex 14 Fr.   09/09/17    0835    Double-lumen;Non-latex   less than 1           Subjective:   Objective: BP (!) 98/53   Pulse 79   Temp 97.8 F (36.6 C) (Oral)   Resp 18   Ht 5\' 2"  (1.575 m)   Wt 68 kg (150 lb)   LMP 01/19/2017   SpO2 100%   BMI 27.44 kg/m  I/O last 3 completed shifts: In: -  Out: 52 [Urine:1; Emesis/NG output:50; Stool:1] No intake/output data recorded.  FHT:  Variable decels . + UC:  +accels SVE:    cx c/c + 2 weeks Labs: Lab Results  Component Value Date   WBC 14.6 (H) 09/09/2017   HGB 10.5 (L) 09/09/2017   HCT 32.8 (L) 09/09/2017   MCV 74.9 (L) 09/09/2017   PLT 279 09/09/2017    Assessment / Plan: Anticipate svd shortly . Neonatology has been notified via nursing  Wendy Maddox 09/09/2017, 9:05 AM

## 2017-09-09 NOTE — Discharge Summary (Signed)
  Obstetric Discharge Summary   Patient ID: Patient Name: Wendy Maddox DOB: 07/23/1991 MRN: [id number]  Date of Admission: 09/07/2017 Date of Delivery: 09/09/2017 at 0924 (manually enter date) Delivered by: Schermerhorn,MD Date of Discharge: 09/11/17 [facility]  WUX:[id number]:Patient's last menstrual period was 01/19/2017. EDC Estimated Date of Delivery: 10/26/17 Gestational Age at Delivery: 2483w2d   Antepartum complications: PPROM at 6333 +0 weeks  Admitting Diagnosis: same . Pt with started on ampicillin and azithromax starting 09/07/17. Steroids were given . Active induction started evening of 09/08/17 for possible early chorio .   Secondary Diagnoses: Patient Active Problem List   Diagnosis Date Noted  . History of pre-eclampsia in prior pregnancy, currently pregnant 09/08/2017  . Preterm premature rupture of membranes (PPROM) with unknown onset of labor 09/07/2017    Augmentation: Pitocin Complications: PTD Intrapartum complications/course:  Delivery Type: SVD  Anesthesia: epidural Placenta: sponatneous Laceration: none Episiotomy: none  Newborn Data: Live born female  Birth Weight: 4 lb 3 oz (1900 g) APGAR: 8, 9  Newborn Delivery   Birth date/time:  09/09/2017 09:24:00 Delivery type:  Vaginal, Spontaneous    ABO, Rh:O pos Antibody: Neg Rubella:  Neg  RPR:   NR HBsAg:   Neg HIV:   Neg GBS:   unknown  Varicella:Immune       Postpartum Course  Patient had an uncomplicated postpartum course.  By time of discharge on PPD#2 her pain was controlled on oral pain medications; she had appropriate lochia and was ambulating, voiding without difficulty and tolerating regular diet.  She was deemed stable for discharge to home.       Labs: CBC Latest Ref Rng & Units 09/09/2017 09/08/2017 09/07/2017  WBC 3.6 - 11.0 K/uL 14.6(H) 13.2(H) 13.2(H)  Hemoglobin 12.0 - 16.0 g/dL 10.5(L) 10.4(L) 11.4(L)  Hematocrit 35.0 - 47.0 % 32.8(L) 32.6(L) 36.0  Platelets  150 - 440 K/uL 279 293 307   O POS  Physical exam:  BP (!) 98/53   Pulse 79   Temp 98.2 F (36.8 C) (Oral)   Resp 18   Ht 5\' 2"  (1.575 m)   Wt 68 kg (150 lb)   LMP 01/19/2017   SpO2 100%   BMI 27.44 kg/m  General: alert and no distress Pulm: normal respiratory effort Lochia: appropriate Abdomen: soft, NT Uterine Fundus: firm, below umbilicus Perineum:intact Extremities: No evidence of DVT seen on physical exam. No lower extremity edema.   Disposition: stable, discharge to home Baby Feeding: breastmilk Baby Disposition: NICU  Contraception:  Prenatal Labs:   ABO, Rh:O pos Antibody: Neg Rubella:  Neg  RPR:   NR HBsAg:   Neg HIV:   Neg GBS:   unknown  Varicella:Immune    Plan:  Wendy SchaumannMikalah Solinger was discharged to home in good condition. [facility] delivery provider in 4 weeks  Discharge Instructions: Per After Visit Summary. Activity: Advance as tolerated. Pelvic rest for 6 weeks.   Diet: Regular Discharge Medications: Iburpofen Over the counter Outpatient follow up: FU at 4 weeks pp.    Signed: Sharee Pimplearon W. Jones, RN, MSN, CNM, FNP

## 2017-09-09 NOTE — Lactation Note (Signed)
This note was copied from a baby's chart. Lactation Consultation Note  Patient Name: Wendy Maddox [id number]'QToday's Date: 09/09/2017 Reason for consult: Initial assessment;Follow-up assessment   Maternal Data  Mother was interested in pumping. She started pumping and has pumped twice today. I was abl;e to show her how to use and clean the parts of the pump. Sh has been shown to use the initiation mode and [facility]  Feeding Feeding Type: Donor Breast Milk Length of feed: 30 min  LATCH Score                   Interventions    Lactation Tools Discussed/Used Tools: Pump;24F feeding tube / Syringe   Consult Status      Trudee GripCarolyn P Samara Stankowski 09/09/2017, 5:28 PM

## 2017-09-10 LAB — CBC
HCT: 31.7 % — ABNORMAL LOW (ref 35.0–47.0)
Hemoglobin: 10.4 g/dL — ABNORMAL LOW (ref 12.0–16.0)
MCH: 24.7 pg — ABNORMAL LOW (ref 26.0–34.0)
MCHC: 32.7 g/dL (ref 32.0–36.0)
MCV: 75.5 fL — AB (ref 80.0–100.0)
PLATELETS: 254 10*3/uL (ref 150–440)
RBC: 4.19 MIL/uL (ref 3.80–5.20)
RDW: 14.7 % — AB (ref 11.5–14.5)
WBC: 19.8 10*3/uL — AB (ref 3.6–11.0)

## 2017-09-10 LAB — RPR: RPR Ser Ql: NONREACTIVE

## 2017-09-10 NOTE — Progress Notes (Addendum)
Post Partum Day 1 Subjective: I am doing fine, baby is in NICU doing ok  Objective: Blood pressure 121/72, pulse 65, temperature 98 F (36.7 C), temperature source Oral, resp. rate 16, height 5\' 2"  (1.575 m), weight 68 kg (150 lb), last menstrual period 01/19/2017, SpO2 99 %, unknown if currently breastfeeding.  Physical Exam:  General: alert, cooperative and appears stated age Lochia: appropriate Uterine Fundus: firm DVT Evaluation: No evidence of DVT seen on physical exam.  Recent Labs    09/09/17 0210 09/10/17 0615  HGB 10.5* 10.4*  HCT 32.8* 31.7*    Assessment/Plan: Plan for discharge tomorrow No lacs noted  LOS: 3 days   Sharee Pimplearon W Jones 09/10/2017, 8:38 AM

## 2017-09-10 NOTE — Lactation Note (Signed)
Lactation Consultation Note  Patient Name: Wendy Maddox [id number]'s Date: 09/10/2017     Maternal Data  Mom states she is pumping every 2-3 hr and getting small amts, approx 1-5 cc, [facility] [place] I had her call [facility]IC to tell them that she will need an electric breast pump after discharge, she had to leave a message and I also called [place] [facility] and left a message that this mom would need an Electric breast pump after D/C   Feeding    LATCH Score                   Interventions    Lactation Tools Discussed/Used     Consult Status      Wendy Maddox 09/10/2017, 4:01 PM

## 2017-09-10 NOTE — Anesthesia Postprocedure Evaluation (Signed)
Anesthesia Post Note  Patient: Wendy Maddox  Procedure(s) Performed: AN AD HOC LABOR EPIDURAL  Patient location during evaluation: Mother Baby Anesthesia Type: Epidural Level of consciousness: awake and alert Pain management: pain level controlled Vital Signs Assessment: post-procedure vital signs reviewed and stable Respiratory status: spontaneous breathing, nonlabored ventilation and respiratory function stable Cardiovascular status: stable Postop Assessment: no headache, no backache and epidural receding Anesthetic complications: no     Last Vitals:  Vitals:   09/10/17 0439 09/10/17 0845  BP: 121/72 112/63  Pulse: 65 73  Resp: 16 18  Temp: 36.7 C 36.8 C  SpO2: 99% 100%    Last Pain:  Vitals:   09/10/17 0845  TempSrc: Oral  PainSc:                  Cam Harnden

## 2017-09-11 NOTE — Discharge Instructions (Signed)

## 2017-09-13 LAB — SURGICAL PATHOLOGY

## 2017-11-10 IMAGING — DX DG CHEST 2V
2 series · 2 of 2 positions shown · non-contrast
Comparison: None.

CLINICAL DATA: Cough for 2 days with fever today.

EXAM:
CHEST  2 VIEW

[w chest pa]
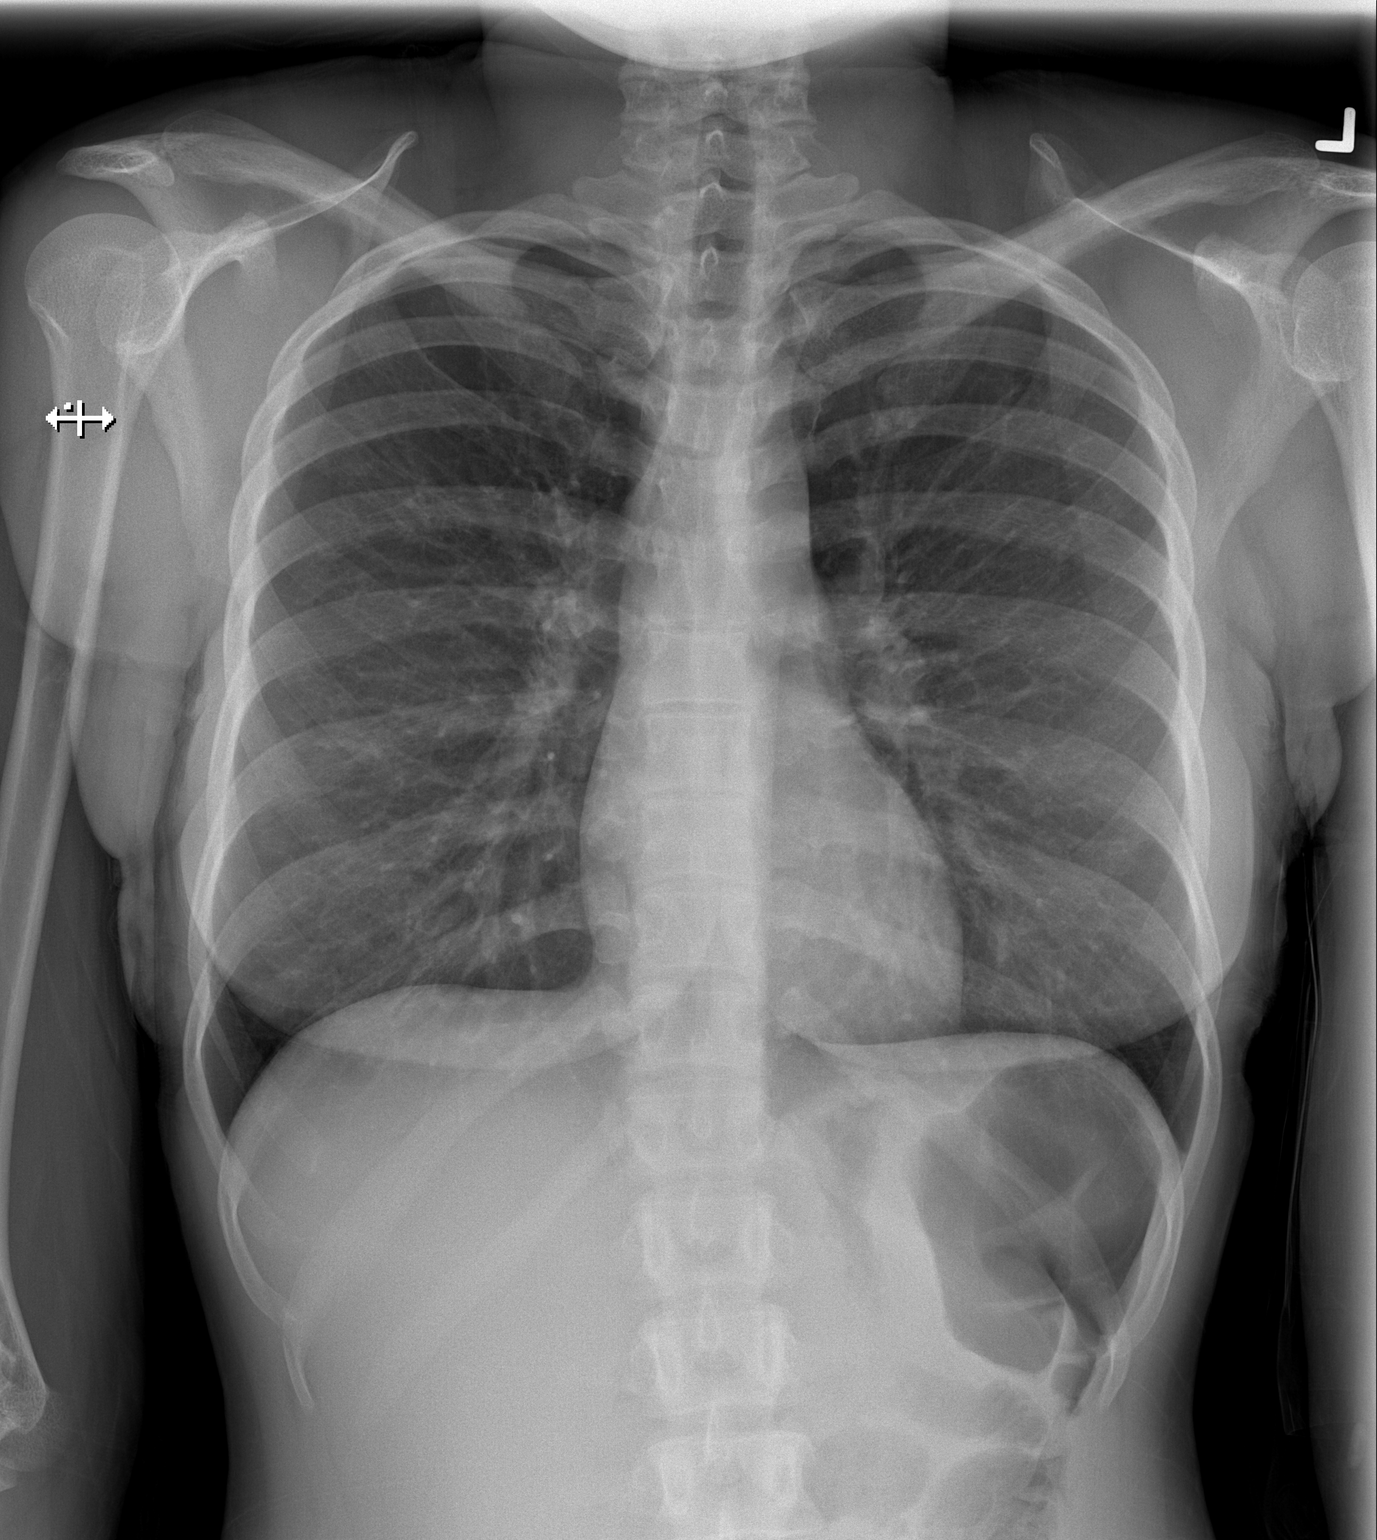

[w chest lat]
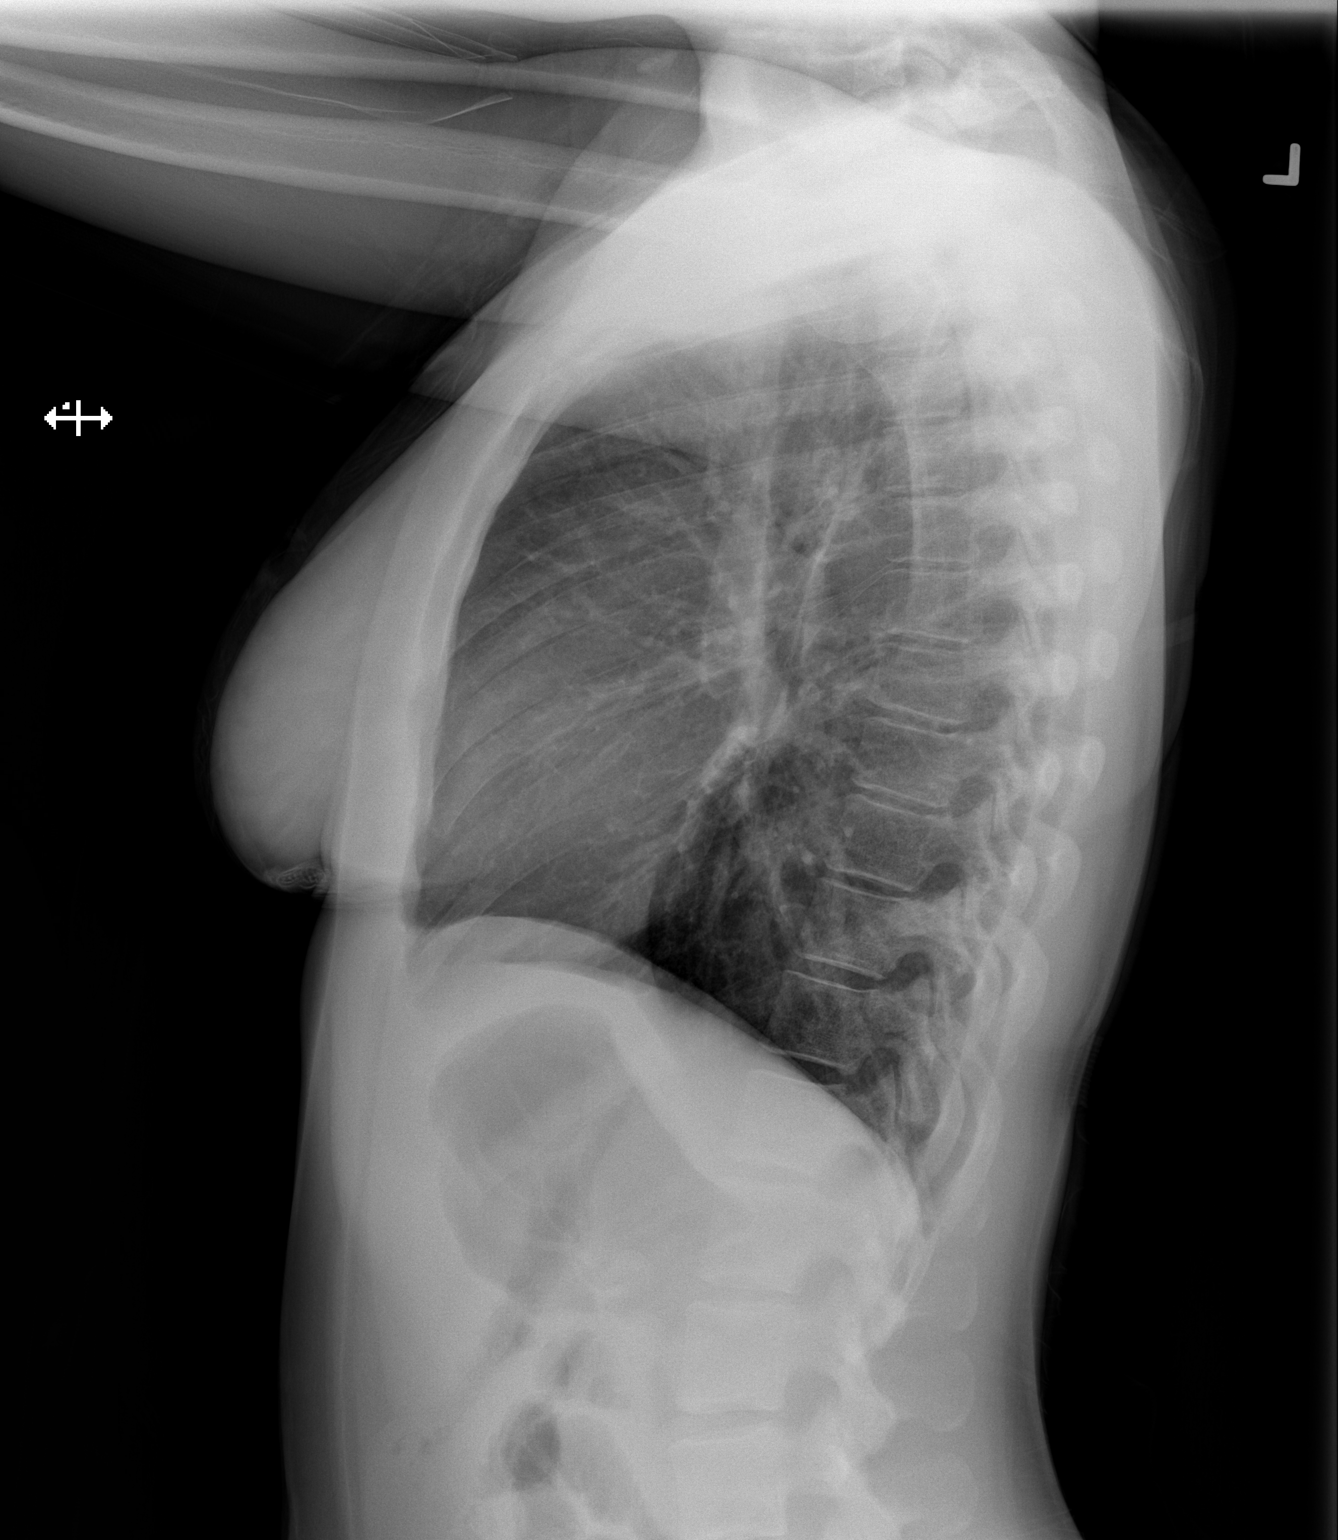

[2 of 2 positions shown; findings below may reference images not displayed]

FINDINGS: The heart size and mediastinal contours are within normal limits.
Both lungs are clear. The visualized skeletal structures are
unremarkable.
IMPRESSION: No active cardiopulmonary disease.  No evidence of pneumonia.

## 2018-01-25 ENCOUNTER — Inpatient Hospital Stay (HOSPITAL_COMMUNITY)
Admission: AD | Admit: 2018-01-25 | Discharge: 2018-01-25 | Disposition: A | Discharge status: Home / Self Care | Payer: Self-pay | Source: Ambulatory Visit | Attending: Obstetrics and Gynecology | Admitting: Obstetrics and Gynecology

## 2018-01-25 ENCOUNTER — Encounter (HOSPITAL_COMMUNITY): Payer: Self-pay | Admitting: *Deleted

## 2018-01-25 ENCOUNTER — Inpatient Hospital Stay (HOSPITAL_COMMUNITY): Payer: Self-pay

## 2018-01-25 DIAGNOSIS — Z3A09 9 weeks gestation of pregnancy: Secondary | ICD-10-CM | POA: Insufficient documentation

## 2018-01-25 DIAGNOSIS — Z79899 Other long term (current) drug therapy: Secondary | ICD-10-CM | POA: Insufficient documentation

## 2018-01-25 DIAGNOSIS — O039 Complete or unspecified spontaneous abortion without complication: Secondary | ICD-10-CM

## 2018-01-25 DIAGNOSIS — Z87891 Personal history of nicotine dependence: Secondary | ICD-10-CM | POA: Insufficient documentation

## 2018-01-25 LAB — URINALYSIS, ROUTINE W REFLEX MICROSCOPIC
Bilirubin Urine: NEGATIVE
Glucose, UA: NEGATIVE mg/dL
KETONES UR: NEGATIVE mg/dL
Nitrite: NEGATIVE
PH: 7 (ref 5.0–8.0)
Protein, ur: NEGATIVE mg/dL
Specific Gravity, Urine: 1.017 (ref 1.005–1.030)

## 2018-01-25 LAB — CBC
HEMATOCRIT: 36.8 % (ref 36.0–46.0)
Hemoglobin: 12.1 g/dL (ref 12.0–15.0)
MCH: 24.8 pg — AB (ref 26.0–34.0)
MCHC: 32.9 g/dL (ref 30.0–36.0)
MCV: 75.4 fL — AB (ref 78.0–100.0)
PLATELETS: 336 10*3/uL (ref 150–400)
RBC: 4.88 MIL/uL (ref 3.87–5.11)
RDW: 15 % (ref 11.5–15.5)
WBC: 6.8 10*3/uL (ref 4.0–10.5)

## 2018-01-25 LAB — WET PREP, GENITAL
Clue Cells Wet Prep HPF POC: NONE SEEN
SPERM: NONE SEEN
Trich, Wet Prep: NONE SEEN
YEAST WET PREP: NONE SEEN

## 2018-01-25 LAB — GC/CHLAMYDIA PROBE AMP (~~LOC~~) NOT AT ARMC
Chlamydia: NEGATIVE
Neisseria Gonorrhea: NEGATIVE

## 2018-01-25 LAB — POCT PREGNANCY, URINE: PREG TEST UR: POSITIVE — AB

## 2018-01-25 NOTE — MAU Provider Note (Signed)
Patient Wendy SchaumannMikalah Maddox is a 27 y.o. [id number] at 7021w3d by LMP here with complaints of spotting off and on for the past three days.  She recently had a baby in December; she was not planning to get pregnant again. She denies abnormal discharge, dysuria, NV, HA or other ob-gyn complaint.  History     CSN: [id number]  Arrival date and time: 01/25/18 0431   None     Chief Complaint  Patient presents with  . Vaginal Bleeding   Vaginal Bleeding  The patient's primary symptoms include vaginal bleeding. The patient's pertinent negatives include no genital itching, genital lesions or genital odor. This is a new problem. The current episode started in the past 7 days. The problem occurs intermittently. The problem has been gradually worsening. Associated symptoms include diarrhea. Pertinent negatives include no abdominal pain. The vaginal discharge was bloody. The vaginal bleeding is spotting. She has not been passing clots. She has not been passing tissue.   She had some pain after intercourse 3 days ago; she started spotting off and on again since then. Tonight the bleeding was a little more than spotting; it was pink streaks. She came in to get checked out.  OB History    Gravida  5   Para  2   Term  1   Preterm  1   AB  2   Living  2     SAB  1   TAB      Ectopic  1   Multiple  0   Live Births  2           Past Medical History:  Diagnosis Date  . Chlamydia   . Dizziness   . Near syncope   . Ovarian cyst   . Palpitations   . Pregnancy induced hypertension     Past Surgical History:  Procedure Laterality Date  . NO PAST SURGERIES      Family History  Problem Relation Age of Onset  . Diabetes Mother   . Bipolar disorder Mother   . Asthma Father   . Asthma Daughter   . Diabetes Maternal Grandmother   . Diabetes Paternal Grandmother     Social History   Tobacco Use  . Smoking status: Former Smoker    Packs/day: 0.25    Types: Cigarettes  . Smokeless  tobacco: Never Used  . Tobacco comment: Oct 2017  Substance Use Topics  . Alcohol use: No    Frequency: Never    Comment: every other week  . Drug use: No    Allergies: No Known Allergies  Medications Prior to Admission  Medication Sig Dispense Refill Last Dose  . Prenatal Vit-Fe Fumarate-FA (PRENATAL VITAMIN PO) Take 1 tablet daily by mouth.    01/24/2018 at Unknown time    Review of Systems  Constitutional: Negative.   HENT: Negative.   Respiratory: Negative.   Cardiovascular: Negative.   Gastrointestinal: Positive for diarrhea. Negative for abdominal pain.  Genitourinary: Positive for vaginal bleeding.  Neurological: Negative.    Physical Exam   Blood pressure 137/83, pulse 79, temperature 98.3 F (36.8 C), temperature source Oral, resp. rate 18, height 5\' 1"  (1.549 m), weight 163 lb 1.3 oz (74 kg), last menstrual period 11/06/2017, unknown if currently breastfeeding.  Physical Exam  Constitutional: She appears well-developed.  HENT:  Head: Normocephalic.  Neck: Normal range of motion.  Respiratory: Effort normal.  GI: Soft.  Genitourinary:  Genitourinary Comments: Normal external genitalia; vaginal walls are pink with no  lesions. Dark red blood extruding from the cervical os. No CMT, suprapubic or adnexal tenderness.   Neurological: She is alert.  Skin: Skin is warm and dry.  Psychiatric: She has a normal mood and affect.    MAU Course  Procedures  MDM -US shows CRL at 9 weeks and 1 day with no cardiac activity.  -wet prep: negative -GC pending  Patient is very upset; does not want cytotec or D and C at this time.   Assessment and Plan   1. Miscarriage    2. Patient stable for discharge. Patient tearful and upset and states she wants to leave.  3. Emphasized to patient that she can change her mind about cytotec or a D and C; for now  she should keep her app[facility] on Wednesday because she may feel differently once she has had time to process her  emotions.   4. Reviewed that she should come back to MAU if she develops fever, abdominal tenderness; she should monitor her bleeding return if her bleeding becomes more than 2 pads in two hours and if she starts feeling light-headed, SOB, chills or foul-smelling discharge. Patient states she has had a miscarriage before and knows what to expect.    Charlesetta Garibaldi Kooistra 01/25/2018, 6:05 AM

## 2018-01-25 NOTE — MAU Note (Signed)
Pt presents to MAU c/o vaginal bleeding in pregnancy. Pt reports her LMP was FEB 2nd 2019. Pt states she was spotting on and off for the past 3days, pt states she was having intercourse and it was hurting and she noticed some vaginal spotting after and since then she has spotted on and off and tonight the bleeding became brighter. Pt states she is not having to wear pads.

## 2018-01-25 NOTE — Discharge Instructions (Signed)

## 2018-01-27 ENCOUNTER — Telehealth: Payer: Self-pay | Admitting: General Practice

## 2018-01-27 ENCOUNTER — Encounter (HOSPITAL_COMMUNITY): Payer: Self-pay | Admitting: *Deleted

## 2018-01-27 ENCOUNTER — Inpatient Hospital Stay (HOSPITAL_COMMUNITY)
Admission: AD | Admit: 2018-01-27 | Discharge: 2018-01-27 | Disposition: A | Discharge status: Home / Self Care | Payer: Self-pay | Source: Ambulatory Visit | Attending: Obstetrics & Gynecology | Admitting: Obstetrics & Gynecology

## 2018-01-27 ENCOUNTER — Inpatient Hospital Stay (HOSPITAL_COMMUNITY): Payer: Self-pay

## 2018-01-27 DIAGNOSIS — Z87891 Personal history of nicotine dependence: Secondary | ICD-10-CM | POA: Insufficient documentation

## 2018-01-27 DIAGNOSIS — O4690 Antepartum hemorrhage, unspecified, unspecified trimester: Secondary | ICD-10-CM

## 2018-01-27 DIAGNOSIS — O021 Missed abortion: Secondary | ICD-10-CM | POA: Insufficient documentation

## 2018-01-27 LAB — CBC
HCT: 35.6 % — ABNORMAL LOW (ref 36.0–46.0)
Hemoglobin: 11.8 g/dL — ABNORMAL LOW (ref 12.0–15.0)
MCH: 24.5 pg — ABNORMAL LOW (ref 26.0–34.0)
MCHC: 33.1 g/dL (ref 30.0–36.0)
MCV: 73.9 fL — AB (ref 78.0–100.0)
PLATELETS: 340 10*3/uL (ref 150–400)
RBC: 4.82 MIL/uL (ref 3.87–5.11)
RDW: 14.5 % (ref 11.5–15.5)
WBC: 9.8 10*3/uL (ref 4.0–10.5)

## 2018-01-27 MED ORDER — IBUPROFEN 800 MG PO TABS: 800.0000 mg | ORAL_TABLET | Freq: Three times a day (TID) | ORAL | 0 refills | Status: AC

## 2018-01-27 MED ORDER — LACTATED RINGERS IV SOLN
INTRAVENOUS | Status: DC
  Administered 2018-01-27: 11:00:00 via INTRAVENOUS

## 2018-01-27 MED ORDER — LACTATED RINGERS IV BOLUS
1000.0000 mL | Freq: Once | INTRAVENOUS | Status: AC
  Administered 2018-01-27: 1000 mL via INTRAVENOUS

## 2018-01-27 MED ORDER — HYDROMORPHONE HCL 1 MG/ML IJ SOLN
1.0000 mg | Freq: Once | INTRAMUSCULAR | Status: AC
Start: 2018-01-27 — End: 2018-01-27
  Administered 2018-01-27: 1 mg via INTRAVENOUS

## 2018-01-27 MED ORDER — HYDROMORPHONE HCL 1 MG/ML IJ SOLN
1.0000 mg | Freq: Once | INTRAMUSCULAR | Status: DC
  Filled 2018-01-27: qty 1

## 2018-01-27 MED ORDER — MISOPROSTOL 200 MCG PO TABS: 800.0000 ug | ORAL_TABLET | Freq: Once | ORAL | 0 refills | Status: AC

## 2018-01-27 MED ORDER — OXYCODONE-ACETAMINOPHEN 5-325 MG PO TABS: 2.0000 | ORAL_TABLET | ORAL | 0 refills | Status: AC | PRN

## 2018-01-27 MED ORDER — PROMETHAZINE HCL 25 MG PO TABS: 25.0000 mg | ORAL_TABLET | Freq: Four times a day (QID) | ORAL | 0 refills | Status: AC | PRN

## 2018-01-27 NOTE — Telephone Encounter (Signed)
Called and notified patient of appointments scheduled for 02/03/18 (lab visit) and 02/14/18 (SAB follow up).  Patient voiced understanding.

## 2018-01-27 NOTE — MAU Note (Signed)
Pt became very dizzy & lightheaded after blood draw, pt lying on R side.  BP 91/56, HR 88, O2 sat 100%.

## 2018-01-27 NOTE — MAU Provider Note (Signed)
History     CSN: [id number]  Arrival date and time: 01/27/18 4098   First Provider Initiated Contact with Patient 01/27/18 [phone]     Chief Complaint  Patient presents with  . Vaginal Bleeding   HPI Wendy Maddox is a 27 y.o. [id number] at [redacted]w[redacted]d who presents with heavy vaginal bleeding. She states she was told 2 days ago that she was having a miscarriage and today she started having bleeding that was heavier than a period and passing large clots. She states she filled the toilet with blood and clots and soaked a pad in less than 10 minutes. She also reports lower abdominal cramping that she rates a 6/10.   OB History    Gravida  5   Para  2   Term  1   Preterm  1   AB  2   Living  2     SAB  1   TAB      Ectopic  1   Multiple  0   Live Births  2           Past Medical History:  Diagnosis Date  . Chlamydia   . Dizziness   . Near syncope   . Ovarian cyst   . Palpitations   . Pregnancy induced hypertension     Past Surgical History:  Procedure Laterality Date  . NO PAST SURGERIES      Family History  Problem Relation Age of Onset  . Diabetes Mother   . Bipolar disorder Mother   . Asthma Father   . Asthma Daughter   . Diabetes Maternal Grandmother   . Diabetes Paternal Grandmother     Social History   Tobacco Use  . Smoking status: Former Smoker    Packs/day: 0.25    Types: Cigarettes  . Smokeless tobacco: Never Used  . Tobacco comment: Oct 2017  Substance Use Topics  . Alcohol use: No    Frequency: Never    Comment: every other week  . Drug use: No    Allergies: No Known Allergies  No medications prior to admission.    Review of Systems  Constitutional: Negative.  Negative for fatigue and fever.  HENT: Negative.   Respiratory: Negative.  Negative for shortness of breath.   Cardiovascular: Negative.  Negative for chest pain.  Gastrointestinal: Positive for abdominal pain. Negative for constipation, diarrhea, nausea and vomiting.   Genitourinary: Positive for vaginal bleeding. Negative for dysuria.  Neurological: Negative.  Negative for dizziness and headaches.   Physical Exam   Blood pressure (!) 141/90, pulse (!) 108, temperature 98.6 F (37 C), temperature source Oral, resp. rate 18, last menstrual period 11/06/2017, unknown if currently breastfeeding.  Patient Vitals for the past 24 hrs:  BP Temp Temp src Pulse Resp SpO2  01/27/18 1036 122/75 - - 71 - -  01/27/18 1013 115/72 - - 80 - -  01/27/18 0958 - - - - - 100 %  01/27/18 0957 104/62 - - 75 - -  01/27/18 0947 (!) 89/54 - - 96 - -  01/27/18 0940 (!) 86/58 - - 89 - -  01/27/18 0938 (!) 91/56 - - 88 16 100 %  01/27/18 0917 (!) 141/90 98.6 F (37 C) Oral (!) 108 18 -     Physical Exam  Nursing note and vitals reviewed. Constitutional: She is oriented to person, place, and time. She appears well-developed and well-nourished. No distress.  HENT:  Head: Normocephalic.  Eyes: Pupils are equal, round, and  reactive to light.  Cardiovascular: Normal rate, regular rhythm and normal heart sounds.  Respiratory: Effort normal and breath sounds normal. No respiratory distress.  GI: Soft. Bowel sounds are normal. She exhibits no distension. There is no tenderness.  Genitourinary: There is bleeding in the vagina.  Genitourinary Comments: Large amount of bright red bleeding with clots. Several softball sized clots removed from vagina and cervix. Continuous brisk bright red bleeding from cervical os.   Neurological: She is alert and oriented to person, place, and time.  Skin: Skin is warm and dry.  Psychiatric: She has a normal mood and affect. Her behavior is normal. Judgment and thought content normal.    MAU Course  Procedures Results for orders placed or performed during the hospital encounter of 01/27/18 (from the past 24 hour(s))  CBC     Status: Abnormal   Collection Time: 01/27/18  9:34 AM  Result Value Ref Range   WBC 9.8 4.0 - 10.5 K/uL   RBC 4.82  3.87 - 5.11 MIL/uL   Hemoglobin 11.8 (L) 12.0 - 15.0 g/dL   HCT 96.0 (L) 45.4 - 09.8 %   MCV 73.9 (L) 78.0 - 100.0 fL   MCH 24.5 (L) 26.0 - 34.0 pg   MCHC 33.1 30.0 - 36.0 g/dL   RDW 11.9 14.7 - 82.9 %   Platelets 340 150 - 400 K/uL    US Ob Transvaginal  Result Date: 01/27/2018 CLINICAL DATA:  Vaginal bleeding EXAM: TRANSVAGINAL OB ULTRASOUND TECHNIQUE: Transvaginal ultrasound was performed for complete evaluation of the gestation as well as the maternal uterus, adnexal regions, and pelvic cul-de-sac. COMPARISON:  None. FINDINGS: Intrauterine gestational sac: None Yolk sac:  Not visualized Embryo:  Not visualized Cardiac Activity: Not visualized Heart Rate:  bpm MSD:   mm    w     d CRL:     mm    w  d                  [place] EDC: Subchorionic hemorrhage:  None visualized. Maternal uterus/adnexae: Thickened, complex endometrium with hypervascularity. Endometrium measures 14 mm in thickness. IMPRESSION: Thickened, heterogeneous and hypervascular endometrium concerning for retained products of conception. Electronically Signed   By: Charlett Nose M.D.   On: 01/27/2018 10:28   MDM CBC O Pos blood type US OB Transvaginal  Dilaudid IV After exam and blood draw, patient became dizzy and hypotensive. LR bolus ordered and patient reported feeling better after fluids.   Reviewed results with patient. Patient desires cytotec for management at this time. Vaginal bleeding and pain precautions discussed at length with patient.   Early Intrauterine Pregnancy Failure  _X_  Documented intrauterine pregnancy failure less than or equal to [redacted] weeks gestation  _X__  No serious current illness  _X__  Baseline Hgb greater than or equal to 10g/dl  _X__  Patient has easily accessible transportation to the hospital  _X__  Clear preference  _X__  Practitioner/physician deems patient reliable  _X__  Counseling by practitioner or physician  _X__  Patient education by RN  _n/a__  Rho-Gam given by RN if  indicated  _X__ Medication dispensed   _X_   Cytotec 800 mcg  _X_   Buccally by patient at home         __   Intravaginally by RN in MAU        __   Rectally by patient at home        __   Rectally by RN in  MAU  _X__  Ibuprofen 600 mg 1 tablet by mouth every 6 hours as needed #30  _X__  Hydrocodone/acetaminophen 5/325 mg by mouth every 4 to 6 hours as needed  _X__  Phenergan 12.5 mg by mouth every 4 hours as needed for nausea   Assessment and Plan   1. Missed abortion   2. Vaginal bleeding in pregnancy    -Discharge home in stable condition -Rx for cytotec, ibuprofen, percocet and phenergan given to patient -Vaginal bleeding and pain precautions discussed -Patient advised to [facility] in [facility] week for HCG and 2 weeks for a visit with a provider, message sent.  -Patient may return to MAU as needed or if her condition were to change or worsen  Rolm BookbinderCaroline M Neill CNM 01/27/2018, 9:34 AM

## 2018-01-27 NOTE — MAU Note (Signed)
Bedside US in process.

## 2018-01-27 NOTE — MAU Note (Signed)
Pt C/O vaginal bleeding, was dx'd with miscarriage two days ago in MAU.  Has bled through pants on arrival, large clots passed when changing clothes.  Having lower abd cramping.  Was lightheaded earlier this morning.

## 2018-01-27 NOTE — Discharge Instructions (Signed)
Cytotec for Pregnancy Failure FACTS YOU SHOULD KNOW  WHAT IS AN EARLY PREGNANCY FAILURE? Once the egg is fertilized with the sperm and begins to develop, it attaches to the lining of the uterus. This early pregnancy tissue may not develop into an embryo (the beginning stage of a baby). Sometimes an embryo does develop but does not continue to grow. These problems can be seen on ultrasound.   MANAGEMNT OF EARLY PREGNANCY FAILURE: About 4 out of 100 (0.25%) women will have a pregnancy loss in her lifetime.  One in five pregnancies is found to be an early pregnancy failure.  There are 3 ways to care for an early pregnancy failure:   (1) Surgery, (2) Medicine, (3) Waiting for you to pass the pregnancy on your own. The decision as to how to proceed after being diagnosed with and early pregnancy failure is an individual one.  The decision can be made only after appropriate counseling.  You need to weigh the pros and cons of the 3 choices. Then you can make the choice that works for you. SURGERY (D&E) . Procedure over in 1 day . Requires being put to sleep . Bleeding may be light . Possible problems during surgery, including injury to womb(uterus) . Care provider has more control Medicine (CYTOTEC) . The complete procedure may take days to weeks . No Surgery . Bleeding may be heavy at times . There may be drug side effects . Patient has more control Waiting . You may choose to wait, in which case your own body may complete the passing of the abnormal early pregnancy on its own in about 2-4 weeks . Your bleeding may be heavy at times . There is a small possibility that you may need surgery if the bleeding is too much or not all of the pregnancy has passed. CYTOTEC MANAGEMENT Prostaglandins (cytotec) are the most widely used drug for this purpose. They cause the uterus to cramp and contract. You will place the medicine yourself inside your vagina in the privacy of your home. Empting of the uterus  should occur within 3 days but the process may continue for several weeks. The bleeding may seem heavy at times. POSSIBLE SIDE EFFECTS FROM CYTOTEC . Nausea   Vomiting . Diarrhea Fever . Chills  Hot Flashes Side effects  from the process of the early pregnancy failure include: . Cramping  Bleeding . Headaches  Dizziness RISKS: This is a low risk procedure. Less than 1 in 100 women has a complication. An incomplete passage of the early pregnancy may occur. Also, Hemorrhage (heavy bleeding) could happen.  Rarely the pregnancy will not be passed completely. Excessively heavy bleeding may occur.  Your doctor may need to perform surgery to empty the uterus (D&E). Afterwards: Everybody will feel differently after the early pregnancy completion. You may have soreness or cramps for a day or two. You may have soreness or cramps for day or two.  You may have light bleeding for up to 2 weeks. You may be as active as you feel like being. If you have any of the following problems you may call Maternity Admissions Unit at [phone]. . If you have pain that does not get better  with pain medication . Bleeding that soaks through 2 thick full-sized sanitary pads in an hour . Cramps that last longer than 2 days . Foul smelling discharge . Fever above 100.4 degrees F Even if you do not have any of these symptoms, you should have a follow-up exam   to make sure you are healing properly. This appointment will be made for you before you leave the hospital. Your next normal period will start again in 4-6 week after the loss. You can get pregnant soon after the loss, so use birth control right away. Finally: Make sure all your questions are answered before during and after any procedure. Follow up with medical care and family planning methods.     

## 2018-02-02 ENCOUNTER — Other Ambulatory Visit: Payer: Self-pay

## 2018-02-02 DIAGNOSIS — O039 Complete or unspecified spontaneous abortion without complication: Secondary | ICD-10-CM

## 2018-02-03 ENCOUNTER — Other Ambulatory Visit: Payer: Medicaid Other

## 2018-02-03 DIAGNOSIS — O039 Complete or unspecified spontaneous abortion without complication: Secondary | ICD-10-CM

## 2018-02-04 LAB — BETA HCG QUANT (REF LAB): HCG QUANT: 26 m[IU]/mL

## 2018-02-14 ENCOUNTER — Ambulatory Visit: Payer: Self-pay | Admitting: Family Medicine

## 2018-03-02 ENCOUNTER — Other Ambulatory Visit: Payer: Self-pay

## 2018-03-02 ENCOUNTER — Emergency Department (HOSPITAL_COMMUNITY)
Admission: EM | Admit: 2018-03-02 | Discharge: 2018-03-02 | Discharge status: Against Medical Advice | Payer: Medicaid Other

## 2018-03-02 ENCOUNTER — Encounter (HOSPITAL_COMMUNITY): Payer: Self-pay | Admitting: *Deleted

## 2018-03-02 NOTE — ED Triage Notes (Deleted)
Pt states she has the stomach flu since 2-3am yesterday.  Reports diarrhea and vomiting associated body aches and mid abdominal pain. LMP 4/23

## 2018-03-02 NOTE — ED Notes (Signed)
Called and no answer X1

## 2018-03-02 NOTE — ED Notes (Signed)
Anything triaged by Lurline Hare or Loney Hering was under wrong patient.  This patient was called and did not answer

## 2018-03-02 NOTE — ED Triage Notes (Signed)
Patient called for triage x 3. Patient not seen in lobby.

## 2018-03-02 NOTE — ED Notes (Signed)
Called no answer

## 2018-03-24 IMAGING — US US OB TRANSVAGINAL
1 series · 15 of 28 positions shown · non-contrast
Comparison: 02/24/2017

CLINICAL DATA: Abdominal pain. First trimester pregnancy with
inconclusive fetal viability. Prior ectopic pregnancy.

EXAM:
TRANSVAGINAL OB ULTRASOUND
TECHNIQUE: Transvaginal ultrasound was performed for complete evaluation of the
gestation as well as the maternal uterus, adnexal regions, and
pelvic cul-de-sac.

[Series 1: us ob transvaginal · 15 of 62 slices shown]
[im 1/62]
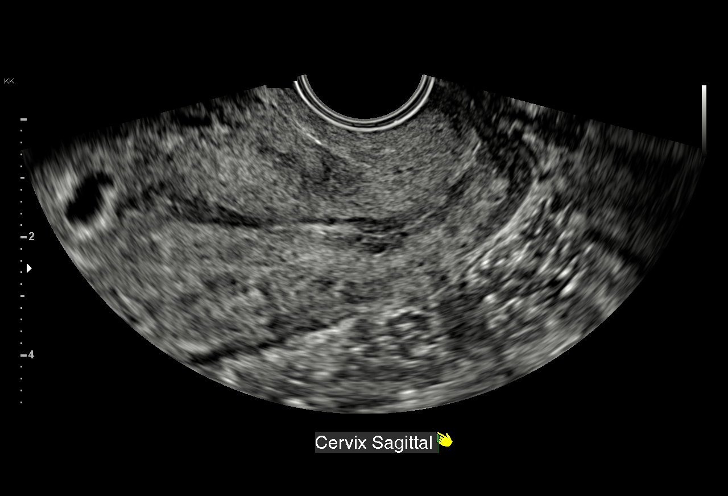
[im 5/62]
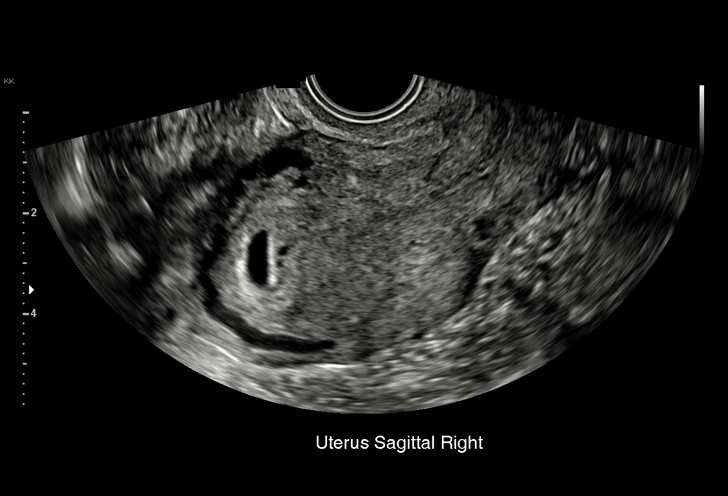
[im 10/62]
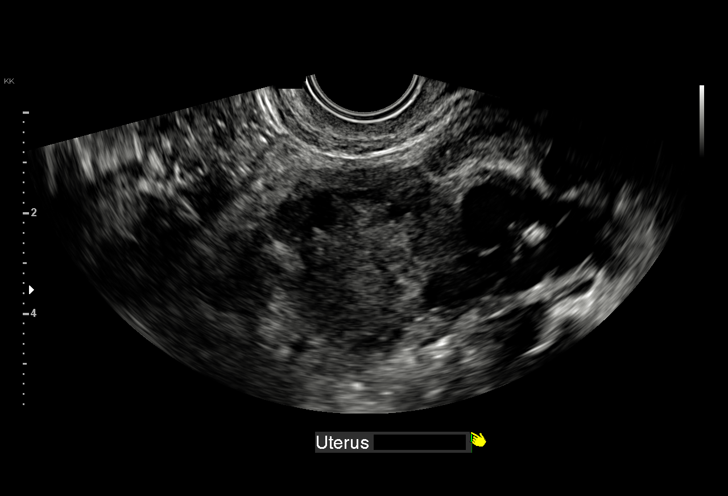
[im 14/62]
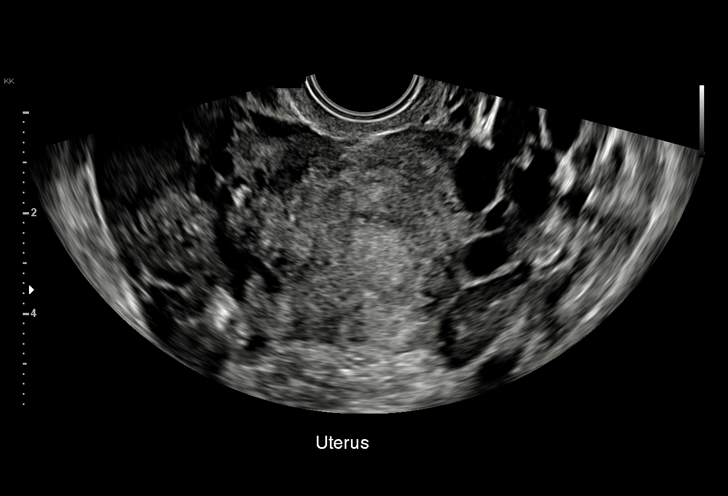
[im 19/62]
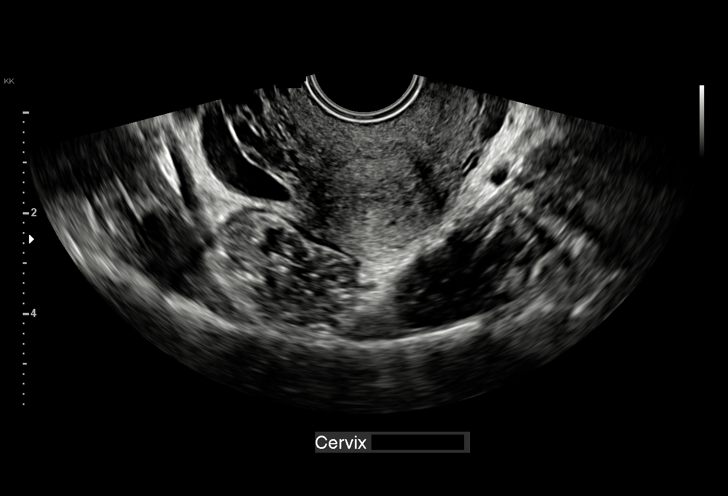
[im 23/62]
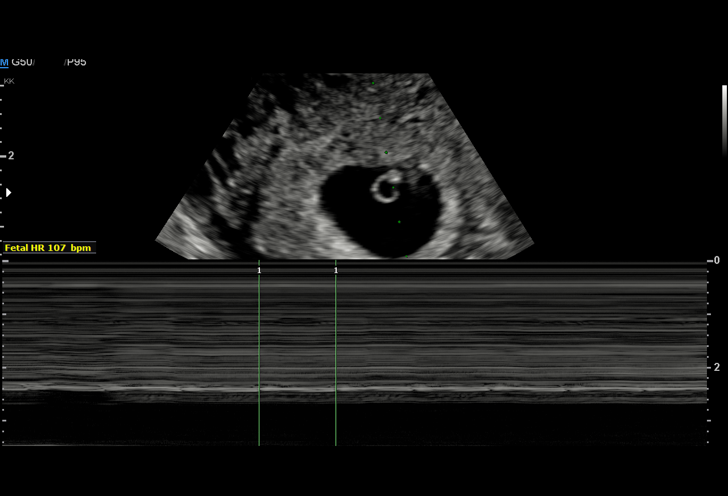
[im 28/62]
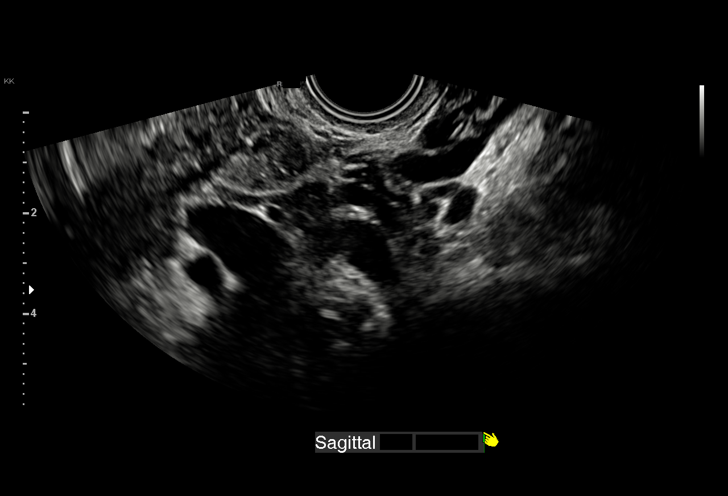
[im 32/62]
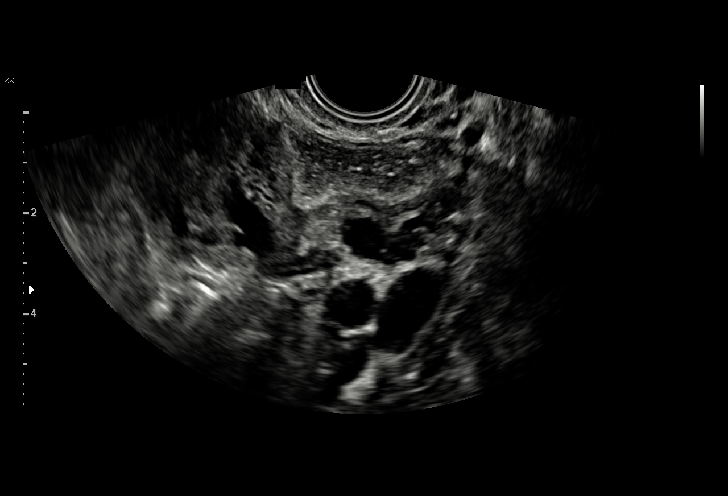
[im 34/62]
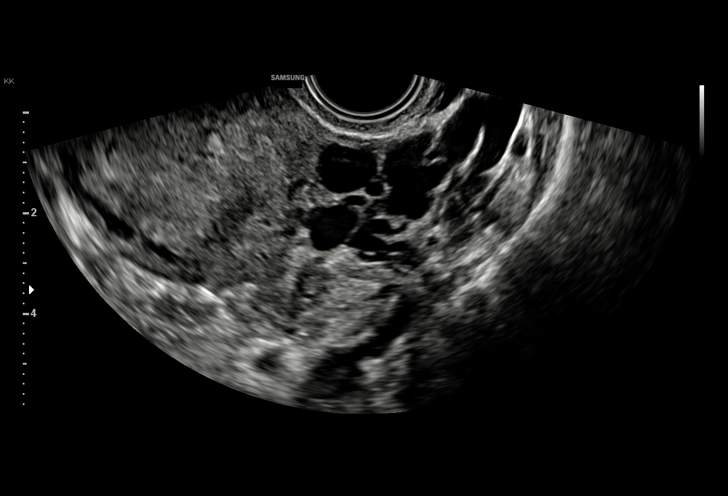
[im 39/62]
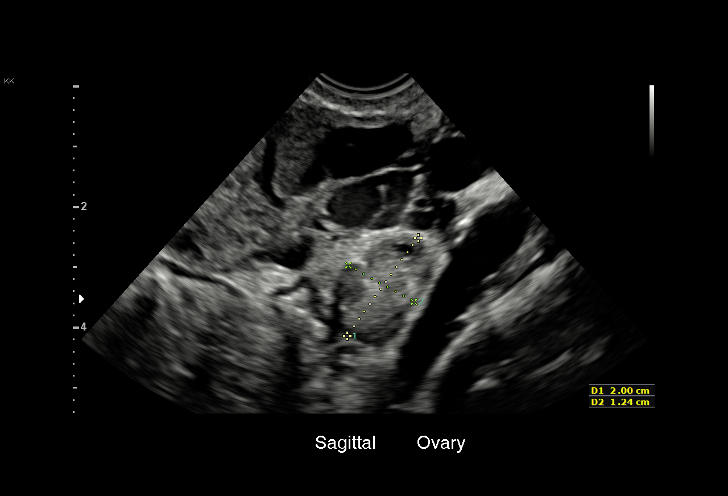
[im 43/62]
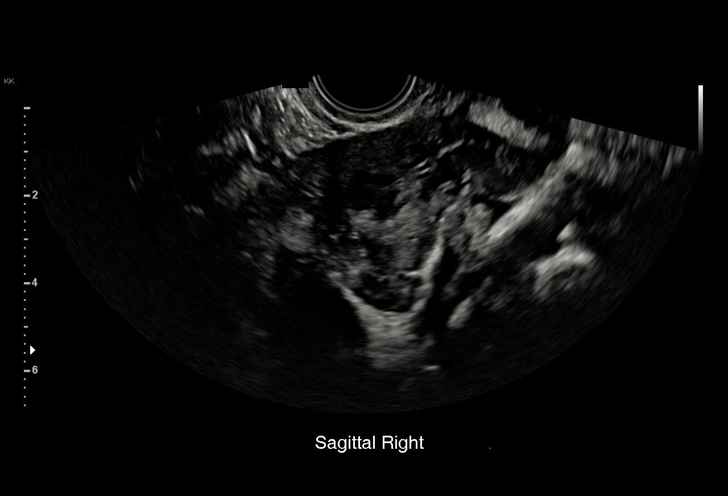
[im 48/62]
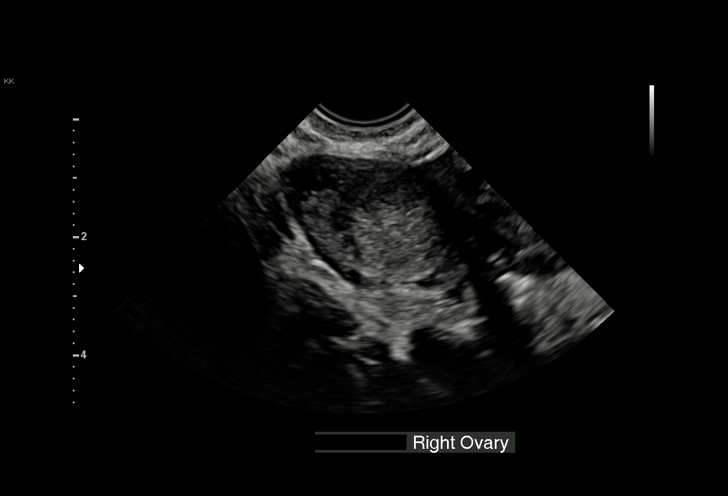
[im 52/62]
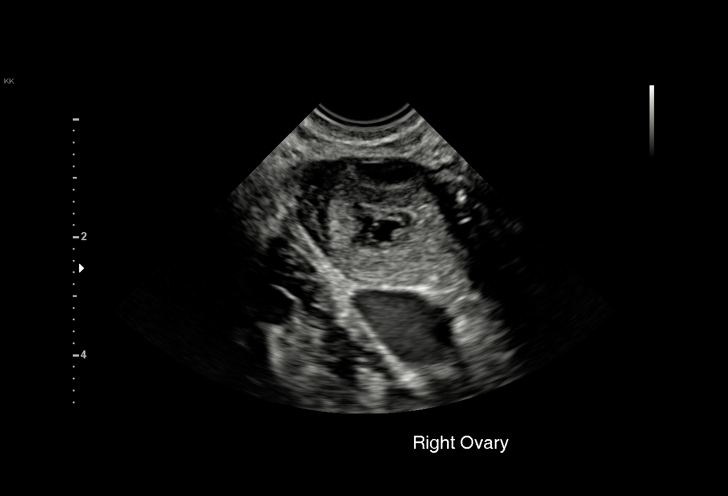
[im 57/62]
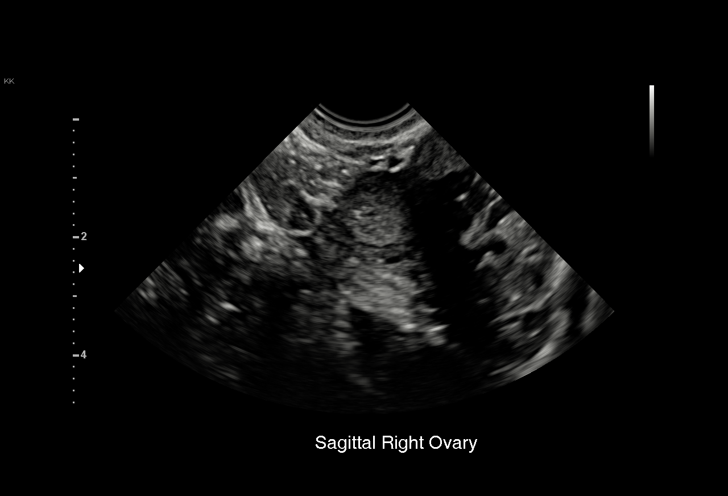
[im 62/62]
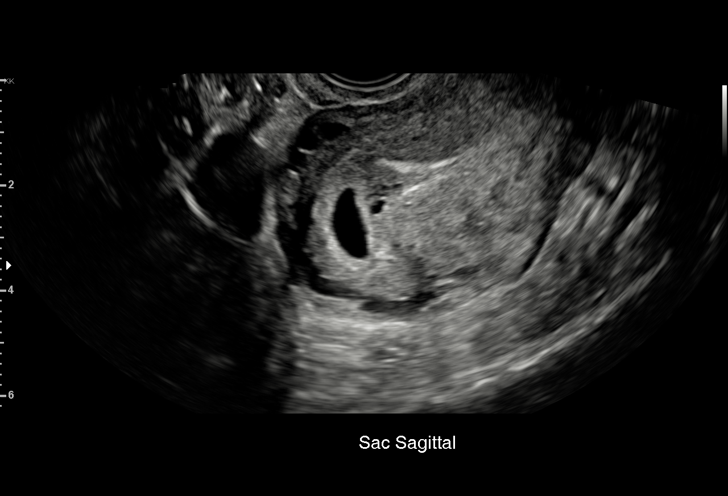

[15 of 28 positions shown; findings below may reference images not displayed]

FINDINGS: Intrauterine gestational sac: Single

Yolk sac:  Visualized.

Embryo:  Visualized.

Cardiac Activity: Visualized.

CRL:   3  mm   5 w 5 d                  US EDC: 10/28/2017

Subchorionic hemorrhage:  None visualized.

Maternal uterus/adnexae: Small right ovarian corpus luteum noted.
Normal appearance of left ovary. No mass or abnormal free fluid
identified.
IMPRESSION: Single living IUP measuring 5 weeks 5 days, with US EDC of
10/28/2017.

No significant maternal uterine or adnexal abnormality identified.

## 2019-10-08 ENCOUNTER — Other Ambulatory Visit: Payer: Self-pay

## 2019-10-08 ENCOUNTER — Emergency Department (HOSPITAL_COMMUNITY)
Admission: EM | Admit: 2019-10-08 | Discharge: 2019-10-08 | Disposition: A | Discharge status: Home / Self Care | Payer: HRSA Program | Attending: Emergency Medicine | Admitting: Emergency Medicine

## 2019-10-08 ENCOUNTER — Encounter (HOSPITAL_COMMUNITY): Payer: Self-pay

## 2019-10-08 DIAGNOSIS — U071 COVID-19: Secondary | ICD-10-CM | POA: Diagnosis not present

## 2019-10-08 DIAGNOSIS — J069 Acute upper respiratory infection, unspecified: Secondary | ICD-10-CM | POA: Diagnosis not present

## 2019-10-08 DIAGNOSIS — Z87891 Personal history of nicotine dependence: Secondary | ICD-10-CM | POA: Diagnosis not present

## 2019-10-08 DIAGNOSIS — Z20822 Contact with and (suspected) exposure to covid-19: Secondary | ICD-10-CM

## 2019-10-08 DIAGNOSIS — Z79899 Other long term (current) drug therapy: Secondary | ICD-10-CM | POA: Diagnosis not present

## 2019-10-08 DIAGNOSIS — R0981 Nasal congestion: Secondary | ICD-10-CM | POA: Diagnosis present

## 2019-10-08 MED ORDER — FLUTICASONE PROPIONATE 50 MCG/ACT NA SUSP: 2.0000 | Freq: Every day | NASAL | 0 refills | Status: AC

## 2019-10-08 NOTE — Discharge Instructions (Addendum)
Today you were were tested for the coronavirus.  The results will be available in the next 2-3 days.  If the results are positive the hospital will contact you.  If they are negative the hospital would not contact you.  You will need to self quarantine until you are aware of your results.  If they are positive you will need to self quarantine as directed below.  You should be isolated for at least 7 days since the onset of your symptoms AND >72 hours after symptoms resolution (absence of fever without the use of fever reducing medication and improvement in respiratory symptoms), whichever is longer  Please follow up with your primary care provider within 5-7 days for re-evaluation of your symptoms. If you do not have a primary care provider, information for a healthcare clinic has been provided for you to make arrangements for follow up care. Please return to the emergency department for any new or worsening symptoms.   

## 2019-10-08 NOTE — ED Provider Notes (Signed)
[facility] DEPT Provider Note   CSN: [id number] Arrival date & time: 10/08/19  1062     History Chief Complaint  Patient presents with  . URI    Wendy Maddox is a 29 y.o. female.  HPI   Pt is a 28 y/o female who presents to the ED today for eval of bilat ear pressure, rhinorrhea, congestion, decreased taste/smell, sore throat, body aches that started a few days ago. She also had a cough that has since resolved. Denies any known fevers. She had diarrhea this AM. Denies any current chest pain or sob.   Denies any known sick contacts but does work at [employer].   Past Medical History:  Diagnosis Date  . Chlamydia   . Dizziness   . Near syncope   . Ovarian cyst   . Palpitations   . Pregnancy induced hypertension     Patient Active Problem List   Diagnosis Date Noted  . Vaginal delivery 09/09/2017  . History of pre-eclampsia in prior pregnancy, currently pregnant 09/08/2017  . Preterm premature rupture of membranes (PPROM) with unknown onset of labor 09/07/2017    Past Surgical History:  Procedure Laterality Date  . NO PAST SURGERIES       OB History    Gravida  5   Para  2   Term  1   Preterm  1   AB  2   Living  2     SAB  1   TAB      Ectopic  1   Multiple  0   Live Births  2           Family History  Problem Relation Age of Onset  . Diabetes Mother   . Bipolar disorder Mother   . Asthma Father   . Asthma Daughter   . Diabetes Maternal Grandmother   . Diabetes Paternal Grandmother     Social History   Tobacco Use  . Smoking status: Former Smoker    Packs/day: 0.25    Types: Cigarettes  . Smokeless tobacco: Never Used  . Tobacco comment: Oct 2017  Substance Use Topics  . Alcohol use: No    Comment: every other week  . Drug use: Yes    Types: Marijuana    Home Medications Prior to Admission medications   Medication Sig Start Date End Date Taking? Authorizing Provider  fluticasone  (FLONASE) 50 MCG/ACT nasal spray Place 2 sprays into both nostrils daily. 10/08/19   Jacqulyne Gladue S, PA-C  ibuprofen (ADVIL,MOTRIN) 800 MG tablet Take 1 tablet (800 mg total) by mouth 3 (three) times daily. 01/27/18   Wende Mott, CNM  misoprostol (CYTOTEC) 200 MCG tablet Take 4 tablets (800 mcg total) by mouth once for 1 dose. 01/27/18 01/27/18  Wende Mott, CNM  oxyCODONE-acetaminophen (PERCOCET/ROXICET) 5-325 MG tablet Take 2 tablets by mouth every 4 (four) hours as needed for severe pain. 01/27/18   Wende Mott, CNM  promethazine (PHENERGAN) 25 MG tablet Take 1 tablet (25 mg total) by mouth every 6 (six) hours as needed for nausea or vomiting. 01/27/18   Wende Mott, CNM    Allergies    Other  Review of Systems   Review of Systems  Constitutional: Negative for chills and fever.  HENT: Positive for congestion, ear pain (pressure), rhinorrhea and sore throat.   Eyes: Negative for pain and visual disturbance.  Respiratory: Positive for cough (resolved). Negative for shortness of breath.   Cardiovascular:  Negative for chest pain.  Gastrointestinal: Positive for diarrhea. Negative for abdominal pain, constipation, nausea and vomiting.  Genitourinary: Negative for dysuria and hematuria.  Musculoskeletal: Positive for myalgias.  Skin: Negative for rash.  Neurological: Negative for headaches.  All other systems reviewed and are negative.   Physical Exam Updated Vital Signs BP (!) 129/96 (BP Location: Left Arm)   Pulse 78   Temp 98 F (36.7 C) (Oral)   Resp 16   Ht 5\' 2"  (1.575 m)   Wt 69.4 kg   SpO2 100%   BMI 27.98 kg/m   Physical Exam Vitals and nursing note reviewed.  Constitutional:      General: She is not in acute distress.    Appearance: She is well-developed. She is not ill-appearing.  HENT:     Head: Normocephalic and atraumatic.     Right Ear: Tympanic membrane normal.     Left Ear: Tympanic membrane normal.  Eyes:     Conjunctiva/sclera:  Conjunctivae normal.  Cardiovascular:     Rate and Rhythm: Normal rate and regular rhythm.     Heart sounds: Normal heart sounds. No murmur.  Pulmonary:     Effort: Pulmonary effort is normal. No respiratory distress.     Breath sounds: Normal breath sounds. No wheezing, rhonchi or rales.  Abdominal:     General: Bowel sounds are normal.     Palpations: Abdomen is soft.     Tenderness: There is no abdominal tenderness. There is no guarding or rebound.  Musculoskeletal:     Cervical back: Neck supple.  Skin:    General: Skin is warm and dry.  Neurological:     Mental Status: She is alert.     ED Results / Procedures / Treatments   Labs (all labs ordered are listed, but only abnormal results are displayed) Labs Reviewed  NOVEL CORONAVIRUS, NAA (HOSP ORDER, SEND-OUT TO REF LAB; TAT 18-24 HRS)  POC SARS CORONAVIRUS 2 AG -  ED    EKG None  Radiology No results found.  Procedures Procedures (including critical care time)  Medications Ordered in ED Medications - No data to display  ED Course  I have reviewed the triage vital signs and the nursing notes.  Pertinent labs & imaging results that were available during my care of the patient were reviewed by me and considered in my medical decision making (see chart for details).    MDM Rules/Calculators/A&P                     29 year old female presenting for evaluation of URI symptoms for the last several days.  Denies any known Covid contacts but does work at [employer].  No shortness of breath or chest pain at this time.  Mainly complaining of ear fullness, nasal congestion.  Did have a cough earlier this week that is since resolved.  She is nontoxic and well-appearing on exam.  Her vital signs are reassuring.  Will check a Covid test.  Will give Flonase for symptomatic management.  Advised on quarantine measures.  Advised on PCP follow-up and return precautions.  She voiced understanding of plan and reasons to return.  All  questions answered patient stable for discharge.  ---  [employer] was evaluated in Emergency Department on 10/08/2019 for the symptoms described in the history of present illness. She was evaluated in the context of the global COVID-19 pandemic, which necessitated consideration that the patient might be at risk for infection with the SARS-CoV-2 virus  that causes COVID-19. Institutional protocols and algorithms that pertain to the evaluation of patients at risk for COVID-19 are in a state of rapid change based on information released by regulatory bodies including the CDC and federal and state organizations. These policies and algorithms were followed during the patient's care in the ED.     Final Clinical Impression(s) / ED Diagnoses Final diagnoses:  Viral URI with cough  Suspected COVID-19 virus infection    Rx / DC Orders ED Discharge Orders         Ordered    fluticasone (FLONASE) 50 MCG/ACT nasal spray  Daily     10/08/19 [address], Danyele Smejkal S, PA-C 10/08/19 1542    Margarita Grizzle, MD 10/09/19 1538

## 2019-10-08 NOTE — ED Triage Notes (Signed)
Pt states that she has pressure in her ears, sore throat, and loss of taste and smell since yesterday. Denies sick contacts.

## 2019-10-09 LAB — NOVEL CORONAVIRUS, NAA (HOSP ORDER, SEND-OUT TO REF LAB; TAT 18-24 HRS): SARS-CoV-2, NAA: DETECTED — AB

## 2020-01-08 ENCOUNTER — Emergency Department (HOSPITAL_COMMUNITY)
Admission: EM | Admit: 2020-01-08 | Discharge: 2020-01-09 | Disposition: A | Discharge status: Home / Self Care | Payer: Medicaid Other | Attending: Emergency Medicine | Admitting: Emergency Medicine

## 2020-01-08 ENCOUNTER — Emergency Department (HOSPITAL_COMMUNITY): Payer: Medicaid Other

## 2020-01-08 ENCOUNTER — Encounter (HOSPITAL_COMMUNITY): Payer: Self-pay | Admitting: Emergency Medicine

## 2020-01-08 ENCOUNTER — Other Ambulatory Visit: Payer: Self-pay

## 2020-01-08 DIAGNOSIS — R0789 Other chest pain: Secondary | ICD-10-CM | POA: Insufficient documentation

## 2020-01-08 DIAGNOSIS — N644 Mastodynia: Secondary | ICD-10-CM | POA: Insufficient documentation

## 2020-01-08 DIAGNOSIS — Z5321 Procedure and treatment not carried out due to patient leaving prior to being seen by health care provider: Secondary | ICD-10-CM | POA: Insufficient documentation

## 2020-01-08 DIAGNOSIS — R0602 Shortness of breath: Secondary | ICD-10-CM | POA: Insufficient documentation

## 2020-01-08 DIAGNOSIS — R42 Dizziness and giddiness: Secondary | ICD-10-CM | POA: Insufficient documentation

## 2020-01-08 LAB — CBC
HCT: 45.4 % (ref 36.0–46.0)
Hemoglobin: 14.4 g/dL (ref 12.0–15.0)
MCH: 24.2 pg — ABNORMAL LOW (ref 26.0–34.0)
MCHC: 31.7 g/dL (ref 30.0–36.0)
MCV: 76.2 fL — ABNORMAL LOW (ref 80.0–100.0)
Platelets: 344 10*3/uL (ref 150–400)
RBC: 5.96 MIL/uL — ABNORMAL HIGH (ref 3.87–5.11)
RDW: 17.8 % — ABNORMAL HIGH (ref 11.5–15.5)
WBC: 10.9 10*3/uL — ABNORMAL HIGH (ref 4.0–10.5)
nRBC: 0 % (ref 0.0–0.2)

## 2020-01-08 LAB — TROPONIN I (HIGH SENSITIVITY)
Troponin I (High Sensitivity): <2 ng/L (ref <18)
Troponin I (High Sensitivity): <2 ng/L (ref <18)

## 2020-01-08 LAB — BASIC METABOLIC PANEL
Anion gap: 13 (ref 5–15)
BUN: 11 mg/dL (ref 6–20)
CO2: 23 mmol/L (ref 22–32)
Calcium: 9.9 mg/dL (ref 8.9–10.3)
Chloride: 104 mmol/L (ref 98–111)
Creatinine, Ser: 0.86 mg/dL (ref 0.44–1.00)
GFR calc Af Amer: >60 mL/min (ref >60)
GFR calc non Af Amer: >60 mL/min (ref >60)
Glucose, Bld: 114 mg/dL — ABNORMAL HIGH (ref 70–99)
Potassium: 3.3 mmol/L — ABNORMAL LOW (ref 3.5–5.1)
Sodium: 140 mmol/L (ref 135–145)

## 2020-01-08 LAB — I-STAT BETA HCG BLOOD, ED (MC, WL, AP ONLY): I-stat hCG, quantitative: <5 m[IU]/mL (ref <5)

## 2020-01-08 NOTE — ED Notes (Signed)
Pt called for room, no answer.

## 2020-01-08 NOTE — ED Triage Notes (Signed)
Pt reports covid in January and has had right sided breast pain. Pain in breast and chest has gotten worse. Pain also in right hip. Endorses intermittent SOB and dizziness.

## 2020-01-09 ENCOUNTER — Emergency Department (HOSPITAL_COMMUNITY): Payer: Self-pay

## 2020-01-09 ENCOUNTER — Emergency Department (HOSPITAL_COMMUNITY)
Admission: EM | Admit: 2020-01-09 | Discharge: 2020-01-09 | Disposition: A | Discharge status: Home / Self Care | Payer: Self-pay | Attending: Emergency Medicine | Admitting: Emergency Medicine

## 2020-01-09 ENCOUNTER — Encounter (HOSPITAL_COMMUNITY): Payer: Self-pay | Admitting: *Deleted

## 2020-01-09 DIAGNOSIS — R079 Chest pain, unspecified: Secondary | ICD-10-CM | POA: Insufficient documentation

## 2020-01-09 DIAGNOSIS — F1210 Cannabis abuse, uncomplicated: Secondary | ICD-10-CM | POA: Insufficient documentation

## 2020-01-09 DIAGNOSIS — M791 Myalgia, unspecified site: Secondary | ICD-10-CM | POA: Insufficient documentation

## 2020-01-09 DIAGNOSIS — Z79899 Other long term (current) drug therapy: Secondary | ICD-10-CM | POA: Insufficient documentation

## 2020-01-09 DIAGNOSIS — F17210 Nicotine dependence, cigarettes, uncomplicated: Secondary | ICD-10-CM | POA: Insufficient documentation

## 2020-01-09 DIAGNOSIS — M25511 Pain in right shoulder: Secondary | ICD-10-CM | POA: Insufficient documentation

## 2020-01-09 LAB — COMPREHENSIVE METABOLIC PANEL
ALT: 29 U/L (ref 0–44)
AST: 19 U/L (ref 15–41)
Albumin: 4.5 g/dL (ref 3.5–5.0)
Alkaline Phosphatase: 92 U/L (ref 38–126)
Anion gap: 13 (ref 5–15)
BUN: 13 mg/dL (ref 6–20)
CO2: 23 mmol/L (ref 22–32)
Calcium: 9.7 mg/dL (ref 8.9–10.3)
Chloride: 106 mmol/L (ref 98–111)
Creatinine, Ser: 0.59 mg/dL (ref 0.44–1.00)
GFR calc Af Amer: >60 mL/min (ref >60)
GFR calc non Af Amer: >60 mL/min (ref >60)
Glucose, Bld: 101 mg/dL — ABNORMAL HIGH (ref 70–99)
Potassium: 3.5 mmol/L (ref 3.5–5.1)
Sodium: 142 mmol/L (ref 135–145)
Total Bilirubin: 0.7 mg/dL (ref 0.3–1.2)
Total Protein: 8.1 g/dL (ref 6.5–8.1)

## 2020-01-09 LAB — CBC WITH DIFFERENTIAL/PLATELET
Abs Immature Granulocytes: 0.03 10*3/uL (ref 0.00–0.07)
Basophils Absolute: 0.1 10*3/uL (ref 0.0–0.1)
Basophils Relative: 1 %
Eosinophils Absolute: 0.1 10*3/uL (ref 0.0–0.5)
Eosinophils Relative: 1 %
HCT: 42.9 % (ref 36.0–46.0)
Hemoglobin: 13.9 g/dL (ref 12.0–15.0)
Immature Granulocytes: 0 %
Lymphocytes Relative: 28 %
Lymphs Abs: 2.7 10*3/uL (ref 0.7–4.0)
MCH: 24.8 pg — ABNORMAL LOW (ref 26.0–34.0)
MCHC: 32.4 g/dL (ref 30.0–36.0)
MCV: 76.5 fL — ABNORMAL LOW (ref 80.0–100.0)
Monocytes Absolute: 0.6 10*3/uL (ref 0.1–1.0)
Monocytes Relative: 6 %
Neutro Abs: 6.2 10*3/uL (ref 1.7–7.7)
Neutrophils Relative %: 64 %
Platelets: 307 10*3/uL (ref 150–400)
RBC: 5.61 MIL/uL — ABNORMAL HIGH (ref 3.87–5.11)
RDW: 16.4 % — ABNORMAL HIGH (ref 11.5–15.5)
WBC: 9.7 10*3/uL (ref 4.0–10.5)
nRBC: 0 % (ref 0.0–0.2)

## 2020-01-09 LAB — SEDIMENTATION RATE: Sed Rate: 0 mm/hr (ref 0–22)

## 2020-01-09 LAB — I-STAT BETA HCG BLOOD, ED (MC, WL, AP ONLY): I-stat hCG, quantitative: <5 m[IU]/mL (ref <5)

## 2020-01-09 MED ORDER — METHOCARBAMOL 500 MG PO TABS: 500.0000 mg | ORAL_TABLET | Freq: Two times a day (BID) | ORAL | 0 refills | Status: AC

## 2020-01-09 MED ORDER — KETOROLAC TROMETHAMINE 30 MG/ML IJ SOLN
30.0000 mg | Freq: Once | INTRAMUSCULAR | Status: AC
  Administered 2020-01-09: 15:00:00 30 mg via INTRAVENOUS
  Filled 2020-01-09: qty 1

## 2020-01-09 MED ORDER — SODIUM CHLORIDE 0.9 % IV SOLN: INTRAVENOUS | Status: DC

## 2020-01-09 MED ORDER — IBUPROFEN 600 MG PO TABS: 600.0000 mg | ORAL_TABLET | Freq: Four times a day (QID) | ORAL | 0 refills | Status: AC | PRN

## 2020-01-09 MED ORDER — LORAZEPAM 2 MG/ML IJ SOLN
0.5000 mg | Freq: Once | INTRAMUSCULAR | Status: AC
  Administered 2020-01-09: 0.5 mg via INTRAVENOUS
  Filled 2020-01-09: qty 1

## 2020-01-09 MED ORDER — MORPHINE SULFATE (PF) 4 MG/ML IV SOLN
4.0000 mg | Freq: Once | INTRAVENOUS | Status: AC
  Administered 2020-01-09: 4 mg via INTRAVENOUS
  Filled 2020-01-09: qty 1

## 2020-01-09 NOTE — ED Triage Notes (Addendum)
EMS reports rt side pain radiating down arm and leg, was at [facility] and had blood work then decided to leave. [facility] 105

## 2020-01-09 NOTE — ED Provider Notes (Signed)
[facility]-EMERGENCY DEPT Provider Note   CSN: [id number] Arrival date & time: 01/09/20  1223     History Chief Complaint  Patient presents with  . Rt Side Pain    Wendy Maddox is a 29 y.o. female.  29 year old female presents with right upper and chest discomfort times several days.  States that for several weeks that she has had pain from her right breast.  Denies any abdominal pain it is positional and that when she lies down on her right side it gets worse.  Denies any nipple drainage or discharge.  No fever or night sweats.  Also endorses pain that is in her right lower extremity that is also worse with movement.  Pain is been atraumatic in nature.  Denies any discomfort on her left side of the body.  Also does note discomfort in her right shoulder.  Denies any new weakness.  No treatment use prior to arrival        Past Medical History:  Diagnosis Date  . Chlamydia   . Dizziness   . Near syncope   . Ovarian cyst   . Palpitations   . Pregnancy induced hypertension     Patient Active Problem List   Diagnosis Date Noted  . Vaginal delivery 09/09/2017  . History of pre-eclampsia in prior pregnancy, currently pregnant 09/08/2017  . Preterm premature rupture of membranes (PPROM) with unknown onset of labor 09/07/2017    Past Surgical History:  Procedure Laterality Date  . NO PAST SURGERIES       OB History    Gravida  5   Para  2   Term  1   Preterm  1   AB  2   Living  2     SAB  1   TAB      Ectopic  1   Multiple  0   Live Births  2           Family History  Problem Relation Age of Onset  . Diabetes Mother   . Bipolar disorder Mother   . Asthma Father   . Asthma Daughter   . Diabetes Maternal Grandmother   . Diabetes Paternal Grandmother     Social History   Tobacco Use  . Smoking status: Former Smoker    Packs/day: 0.25    Types: Cigarettes  . Smokeless tobacco: Never Used  . Tobacco comment: Oct 2017   Substance Use Topics  . Alcohol use: No    Comment: every other week  . Drug use: Yes    Types: Marijuana    Home Medications Prior to Admission medications   Medication Sig Start Date End Date Taking? Authorizing Provider  fluticasone (FLONASE) 50 MCG/ACT nasal spray Place 2 sprays into both nostrils daily. 10/08/19   Couture, Cortni S, PA-C  ibuprofen (ADVIL,MOTRIN) 800 MG tablet Take 1 tablet (800 mg total) by mouth 3 (three) times daily. 01/27/18   Rolm Bookbinder, CNM  misoprostol (CYTOTEC) 200 MCG tablet Take 4 tablets (800 mcg total) by mouth once for 1 dose. 01/27/18 01/27/18  Rolm Bookbinder, CNM  oxyCODONE-acetaminophen (PERCOCET/ROXICET) 5-325 MG tablet Take 2 tablets by mouth every 4 (four) hours as needed for severe pain. 01/27/18   Rolm Bookbinder, CNM  promethazine (PHENERGAN) 25 MG tablet Take 1 tablet (25 mg total) by mouth every 6 (six) hours as needed for nausea or vomiting. 01/27/18   Rolm Bookbinder, CNM    Allergies    Other  Review of Systems   Review of Systems  All other systems reviewed and are negative.   Physical Exam Updated Vital Signs BP (!) 132/108 (BP Location: Left Arm)   Pulse (!) 105   Temp 98.4 F (36.9 C) (Oral)   Resp 20   Ht 1.575 m (5\' 2" )   Wt 56.7 kg   LMP  (LMP Unknown)   SpO2 100%   BMI 22.86 kg/m   Physical Exam Vitals and nursing note reviewed.  Constitutional:      General: She is not in acute distress.    Appearance: Normal appearance. She is well-developed. She is not toxic-appearing.  HENT:     Head: Normocephalic and atraumatic.  Eyes:     General: Lids are normal.     Conjunctiva/sclera: Conjunctivae normal.     Pupils: Pupils are equal, round, and reactive to light.  Neck:     Thyroid: No thyroid mass.     Trachea: No tracheal deviation.  Cardiovascular:     Rate and Rhythm: Normal rate and regular rhythm.     Heart sounds: Normal heart sounds. No murmur. No gallop.   Pulmonary:     Effort: Pulmonary  effort is normal. No respiratory distress.     Breath sounds: Normal breath sounds. No stridor. No decreased breath sounds, wheezing, rhonchi or rales.  Chest:     Breasts:        Right: No swelling, nipple discharge, skin change or tenderness.     Abdominal:     General: Bowel sounds are normal. There is no distension.     Palpations: Abdomen is soft.     Tenderness: There is no abdominal tenderness. There is no rebound.  Musculoskeletal:        General: No tenderness.     Cervical back: Normal range of motion and neck supple.     Right hip: No tenderness. Decreased range of motion. Normal strength.     Comments: Range of motion at the right shoulder but no erythema or warmness to the touch of the right shoulder.  Skin:    General: Skin is warm and dry.     Findings: No abrasion or rash.  Neurological:     Mental Status: She is alert and oriented to person, place, and time.     GCS: GCS eye subscore is 4. GCS verbal subscore is 5. GCS motor subscore is 6.     Cranial Nerves: No cranial nerve deficit.     Sensory: No sensory deficit.  Psychiatric:        Speech: Speech normal.        Behavior: Behavior normal.     ED Results / Procedures / Treatments   Labs (all labs ordered are listed, but only abnormal results are displayed) Labs Reviewed - No data to display  EKG None  Radiology DG Chest 2 View  Result Date: 01/08/2020 CLINICAL DATA:  Pain in RIGHT breast and chest that has gotten worse, shortness of breath, dizziness, had COVID-19 in January 2021, former smoker EXAM: CHEST - 2 VIEW COMPARISON:  10/19/2016 FINDINGS: Normal heart size, mediastinal contours, and pulmonary vascularity. Lungs clear. No pleural effusion or pneumothorax. Bones unremarkable. IMPRESSION: Normal exam. Electronically Signed   By: Lavonia Dana M.D.   On: 01/08/2020 16:05    Procedures Procedures (including critical care time)  Medications Ordered in ED Medications  0.9 %  sodium chloride  infusion (has no administration in time range)  morphine 4 MG/ML injection 4  mg (has no administration in time range)  LORazepam (ATIVAN) injection 0.5 mg (has no administration in time range)    ED Course  I have reviewed the triage vital signs and the nursing notes.  Pertinent labs & imaging results that were available during my care of the patient were reviewed by me and considered in my medical decision making (see chart for details).    MDM Rules/Calculators/A&P                      Patient given medication for pain here and feels better.  Labs are reassuring.  Sed rate is pending.  If elevated will need to be placed on prednisone.  Care turned over to Dr. Charm Barges Final Clinical Impression(s) / ED Diagnoses Final diagnoses:  Chest pain    Rx / DC Orders ED Discharge Orders    None       Lorre Nick, MD 01/09/20 1537

## 2020-01-09 NOTE — ED Provider Notes (Signed)
Signout from Dr. Zenia Resides.  29 year old female with chest shoulder pain.  Symptomatically improved here.  Plan is to follow-up on ESR.  If markedly elevated greater than 50 should probably be on some steroids.  Other discharge medicines already written for. Physical Exam  BP 107/71   Pulse 61   Temp 98.4 F (36.9 C) (Oral)   Resp 18   Ht 5' 2"  (1.575 m)   Wt 56.7 kg   LMP  (LMP Unknown)   SpO2 100%   BMI 22.86 kg/m   Physical Exam  ED Course/Procedures     Procedures  MDM  Sed rate was zero. Will discharge per Dr. Ayesha Rumpf prior instructions       Hayden Rasmussen, MD 01/09/20 [phone]

## 2020-02-26 ENCOUNTER — Emergency Department (HOSPITAL_COMMUNITY): Payer: Medicaid Other

## 2020-02-26 ENCOUNTER — Other Ambulatory Visit: Payer: Self-pay

## 2020-02-26 ENCOUNTER — Encounter (HOSPITAL_COMMUNITY): Payer: Self-pay

## 2020-02-26 ENCOUNTER — Emergency Department (HOSPITAL_COMMUNITY)
Admission: EM | Admit: 2020-02-26 | Discharge: 2020-02-26 | Disposition: A | Discharge status: Home / Self Care | Payer: Medicaid Other | Attending: Emergency Medicine | Admitting: Emergency Medicine

## 2020-02-26 DIAGNOSIS — G5601 Carpal tunnel syndrome, right upper limb: Secondary | ICD-10-CM | POA: Insufficient documentation

## 2020-02-26 DIAGNOSIS — M25531 Pain in right wrist: Secondary | ICD-10-CM

## 2020-02-26 DIAGNOSIS — Z87891 Personal history of nicotine dependence: Secondary | ICD-10-CM | POA: Insufficient documentation

## 2020-02-26 DIAGNOSIS — M67431 Ganglion, right wrist: Secondary | ICD-10-CM | POA: Insufficient documentation

## 2020-02-26 DIAGNOSIS — M6740 Ganglion, unspecified site: Secondary | ICD-10-CM

## 2020-02-26 NOTE — ED Provider Notes (Signed)
[facility] Provider Note   CSN: [id number] Arrival date & time: 02/26/20  1245     History No chief complaint on file.   Wendy Maddox is a 29 y.o. female who presents to the ED today with complaint of gradual onset, constant, worsening, R wrist pain s/2 knot pt noticed x 1 month ago. Pt reports the pain began about 1 week ago. No trauma. She states that the pain radiates into her elbow and causing a "shooting" type pain.  Patient has been taking 400 mg ibuprofen 3 times daily without relief.  States she had to leave work today due to worsening pain after lifting heavy boxes, patient works for [employer].  She denies any weakness, numbness, tingling in her hand.  No fevers or chills.   The history is provided by the patient and medical records.       Past Medical History:  Diagnosis Date  . Chlamydia   . Dizziness   . Near syncope   . Ovarian cyst   . Palpitations   . Pregnancy induced hypertension     Patient Active Problem List   Diagnosis Date Noted  . Vaginal delivery 09/09/2017  . History of pre-eclampsia in prior pregnancy, currently pregnant 09/08/2017  . Preterm premature rupture of membranes (PPROM) with unknown onset of labor 09/07/2017    Past Surgical History:  Procedure Laterality Date  . NO PAST SURGERIES       OB History    Gravida  5   Para  2   Term  1   Preterm  1   AB  2   Living  2     SAB  1   TAB      Ectopic  1   Multiple  0   Live Births  2           Family History  Problem Relation Age of Onset  . Diabetes Mother   . Bipolar disorder Mother   . Asthma Father   . Asthma Daughter   . Diabetes Maternal Grandmother   . Diabetes Paternal Grandmother     Social History   Tobacco Use  . Smoking status: Former Smoker    Packs/day: 0.25    Types: Cigarettes  . Smokeless tobacco: Never Used  . Tobacco comment: Oct 2017  Substance Use Topics  . Alcohol use: No    Comment:  every other week  . Drug use: Yes    Types: Marijuana    Home Medications Prior to Admission medications   Medication Sig Start Date End Date Taking? Authorizing Provider  Fluconazole (DIFLUCAN PO) Take 1 tablet by mouth as needed (yeast infection).    Yes [provider]  metroNIDAZOLE (FLAGYL PO) Take 4 tablets by mouth every 30 (thirty) days. Pt stated she was on suppression therapy and only uses meds once a month. Stated she is on this medication regimen for four months. Pt does not know dose.   Yes [provider]  fluticasone (FLONASE) 50 MCG/ACT nasal spray Place 2 sprays into both nostrils daily. Patient not taking: Reported on 02/26/2020 10/08/19   Couture, Cortni S, PA-C  ibuprofen (ADVIL) 600 MG tablet Take 1 tablet (600 mg total) by mouth every 6 (six) hours as needed. Patient not taking: Reported on 02/26/2020 01/09/20   Lacretia Leigh, MD  ibuprofen (ADVIL,MOTRIN) 800 MG tablet Take 1 tablet (800 mg total) by mouth 3 (three) times daily. Patient not taking: Reported on 02/26/2020 01/27/18  Cleone Slim M, CNM  methocarbamol (ROBAXIN) 500 MG tablet Take 1 tablet (500 mg total) by mouth 2 (two) times daily. Patient not taking: Reported on 02/26/2020 01/09/20   Lorre Nick, MD  misoprostol (CYTOTEC) 200 MCG tablet Take 4 tablets (800 mcg total) by mouth once for 1 dose. Patient not taking: Reported on 02/26/2020 01/27/18 01/27/18  Rolm Bookbinder, CNM  oxyCODONE-acetaminophen (PERCOCET/ROXICET) 5-325 MG tablet Take 2 tablets by mouth every 4 (four) hours as needed for severe pain. Patient not taking: Reported on 02/26/2020 01/27/18   Rolm Bookbinder, CNM  promethazine (PHENERGAN) 25 MG tablet Take 1 tablet (25 mg total) by mouth every 6 (six) hours as needed for nausea or vomiting. Patient not taking: Reported on 02/26/2020 01/27/18   Rolm Bookbinder, CNM    Allergies    Patient has no active allergies.  Review of Systems   Review of Systems  Constitutional:  Negative for chills and fever.  Musculoskeletal: Positive for arthralgias.  Skin: Negative for wound.    Physical Exam Updated Vital Signs BP 109/73 (BP Location: Left Arm)   Pulse 95   Temp 98.2 F (36.8 C) (Oral)   Resp 16   Ht 5\' 1"  (1.549 m)   Wt 59.4 kg   SpO2 100%   BMI 24.75 kg/m   Physical Exam Vitals and nursing note reviewed.  Constitutional:      Appearance: She is not ill-appearing.  HENT:     Head: Normocephalic and atraumatic.  Eyes:     Conjunctiva/sclera: Conjunctivae normal.  Cardiovascular:     Rate and Rhythm: Normal rate and regular rhythm.  Pulmonary:     Effort: Pulmonary effort is normal.     Breath sounds: Normal breath sounds. No wheezing, rhonchi or rales.  Musculoskeletal:     Comments: No obvious nodule noted when R hand is at a neutral position. With flexion of the wrist small bony nodule appreciated between 2nd and 3rd proximal metacarpal with TTP. ROM intact with flexion and extension of wrist. ROM intact to all digits on right hand. Cap refill < 2 seconds. 2+ radial pulse. + Tinel's sign and Phalen's sign.   Skin:    General: Skin is warm and dry.     Coloration: Skin is not jaundiced.  Neurological:     Mental Status: She is alert.     ED Results / Procedures / Treatments   Labs (all labs ordered are listed, but only abnormal results are displayed) Labs Reviewed - No data to display  EKG None  Radiology DG Wrist Complete Right  Result Date: 02/26/2020 CLINICAL DATA:  Painful knot between the second and third metacarpal. EXAM: RIGHT WRIST - COMPLETE 3+ VIEW COMPARISON:  None. FINDINGS: There is no evidence of fracture or dislocation. There is no evidence of arthropathy or other focal bone abnormality. Soft tissues are unremarkable. IMPRESSION: No acute abnormality is noted. Electronically Signed   By: 02/28/2020 M.D.   On: 02/26/2020 15:14    Procedures Procedures (including critical care time)  Medications Ordered in  ED Medications - No data to display  ED Course  I have reviewed the triage vital signs and the nursing notes.  Pertinent labs & imaging results that were available during my care of the patient were reviewed by me and considered in my medical decision making (see chart for details).    MDM Rules/Calculators/A&P  29 year old female presenting to the ED with atraumatic knot to R hand x 1 month with pain. On arrival to the ED pt is afebrile, nontachycardic, and nontachypneic. Appears to be in NAD. On exam no definitive nodule appreciated when wrist is in a neutral position however with flexion of the wrist there is a small 1 x 1 cm hard nodule with TTP on dorsal aspect of wrist. Pain appears to be worsened with flexion and extension of wrist. Pt is also noted to have positive tinels and phalen's test. She does use her hands quite frequently for work and is right hand dominant. Question early presentation of ganglion cyst vs bony growth causing discomfort vs carpal tunnel. Will obtain xray at this time and reevaluate.   Xray negative for obvious deformity or growth. Will apply thumb spica at this time and discharge pt home. Have encouraged wearing brace while at work as well as nighttime to help sleep. Have encouraged RICE therapy when she is not using her hands. Ibuprofen and Tylenol PRN for pain. Will give referral to ortho if no improvement with symptomatic care. Pt is in agreement with plan and stable for discharge home.   This note was prepared using Dragon voice recognition software and may include unintentional dictation errors due to the inherent limitations of voice recognition software.  Final Clinical Impression(s) / ED Diagnoses Final diagnoses:  Right wrist pain  Carpal tunnel syndrome of right wrist  Ganglion cyst    Rx / DC Orders ED Discharge Orders    None       Discharge Instructions     Your xray did not show any acute findings today Your symptoms  are likely related to a degree or carpal tunnel syndrome as well as possible early ganglion cyst  Please wear wrist brace while at work as well as sleeping During breaks from work/when you are awake please rest, ice, and elevate your wrist You can take Ibuprofen and Tylenol as needed for pain If no improvement in your symptoms after about 2-4 weeks please follow up with Dr. Roney Mans for further evaluation Return to the ED for any worsening symptoms       Tanda Rockers, PA-C 02/26/20 1538    Melene Plan, DO 02/27/20 0710

## 2020-02-26 NOTE — ED Notes (Signed)
Pt to xray

## 2020-02-26 NOTE — ED Triage Notes (Signed)
Patient complains of knot on hand x 1 month, complains of pain to same, denies trauma

## 2020-02-26 NOTE — ED Notes (Signed)
Pt d/c home per MD order. Discharge summary reviewed with pt, pt verbalizes understanding. Ambulatory off unit. No s/s of acute distress noted.  

## 2020-02-26 NOTE — Discharge Instructions (Signed)
Your xray did not show any acute findings today Your symptoms are likely related to a degree or carpal tunnel syndrome as well as possible early ganglion cyst  Please wear wrist brace while at work as well as sleeping During breaks from work/when you are awake please rest, ice, and elevate your wrist You can take Ibuprofen and Tylenol as needed for pain If no improvement in your symptoms after about 2-4 weeks please follow up with Dr. Roney Mans for further evaluation Return to the ED for any worsening symptoms

## 2021-08-24 ENCOUNTER — Other Ambulatory Visit: Payer: Self-pay

## 2021-08-24 ENCOUNTER — Emergency Department (HOSPITAL_COMMUNITY)
Admission: EM | Admit: 2021-08-24 | Discharge: 2021-08-25 | Disposition: A | Discharge status: Home / Self Care | Payer: Medicaid Other | Attending: Emergency Medicine | Admitting: Emergency Medicine

## 2021-08-24 ENCOUNTER — Emergency Department (HOSPITAL_COMMUNITY): Payer: Medicaid Other

## 2021-08-24 DIAGNOSIS — Z87891 Personal history of nicotine dependence: Secondary | ICD-10-CM | POA: Diagnosis not present

## 2021-08-24 DIAGNOSIS — R519 Headache, unspecified: Secondary | ICD-10-CM | POA: Diagnosis not present

## 2021-08-24 DIAGNOSIS — H109 Unspecified conjunctivitis: Secondary | ICD-10-CM | POA: Diagnosis not present

## 2021-08-24 DIAGNOSIS — H5712 Ocular pain, left eye: Secondary | ICD-10-CM | POA: Diagnosis present

## 2021-08-24 MED ORDER — CYCLOPENTOLATE HCL 1 % OP SOLN
1.0000 [drp] | Freq: Once | OPHTHALMIC | Status: AC
  Administered 2021-08-25: 1 [drp] via OPHTHALMIC
  Filled 2021-08-24: qty 2

## 2021-08-24 MED ORDER — TETRACAINE HCL 0.5 % OP SOLN
2.0000 [drp] | Freq: Once | OPHTHALMIC | Status: AC
  Administered 2021-08-24: 2 [drp] via OPHTHALMIC
  Filled 2021-08-24: qty 4

## 2021-08-24 MED ORDER — OXYCODONE-ACETAMINOPHEN 5-325 MG PO TABS
1.0000 | ORAL_TABLET | Freq: Once | ORAL | Status: AC
  Administered 2021-08-24: 1 via ORAL
  Filled 2021-08-24: qty 1

## 2021-08-24 MED ORDER — FLUORESCEIN SODIUM 1 MG OP STRP
1.0000 | ORAL_STRIP | Freq: Once | OPHTHALMIC | Status: AC
  Administered 2021-08-24: 1 via OPHTHALMIC
  Filled 2021-08-24: qty 1
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
## 2021-08-24 NOTE — Discharge Instructions (Addendum)
[facility] today to be evaluated for your left eye pain.  The CT scan of your eye orbits showed no broken bones or dislocations.  Your physical exam was concerning for a more serious condition and you will need to follow-up with the ophthalmologist Dr. Cathey Endow tomorrow morning.  Please arrive at his office at 8:15 AM, you may have to wait for several hours before being seen.  It is very important that you follow-up with the ophthalmologist.  Get help right away if: You have severe eye pain that does not get better with medicine. You have vision loss.
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
